# Patient Record
Sex: Male | Born: 1950 | Race: White | Hispanic: No | Marital: Married | Smoking: Current every day smoker
Health system: Southern US, Community
[De-identification: ages and names within clinical notes are randomized; demographics above are authoritative.]

## PROBLEM LIST (undated history)

## (undated) DIAGNOSIS — C801 Malignant (primary) neoplasm, unspecified: Secondary | ICD-10-CM

## (undated) DIAGNOSIS — I10 Essential (primary) hypertension: Secondary | ICD-10-CM

## (undated) DIAGNOSIS — F1010 Alcohol abuse, uncomplicated: Secondary | ICD-10-CM

## (undated) HISTORY — PX: COLONOSCOPY W/ BIOPSIES AND POLYPECTOMY: SHX1376

---

## 2012-01-17 ENCOUNTER — Emergency Department (HOSPITAL_COMMUNITY)
Admission: EM | Admit: 2012-01-17 | Discharge: 2012-01-18 | Disposition: A | Discharge status: Other Acute Inpatient Hospital | Payer: PRIVATE HEALTH INSURANCE | Attending: Emergency Medicine | Admitting: Emergency Medicine

## 2012-01-17 ENCOUNTER — Encounter (HOSPITAL_COMMUNITY): Payer: Self-pay

## 2012-01-17 DIAGNOSIS — I10 Essential (primary) hypertension: Secondary | ICD-10-CM | POA: Insufficient documentation

## 2012-01-17 DIAGNOSIS — F17200 Nicotine dependence, unspecified, uncomplicated: Secondary | ICD-10-CM | POA: Insufficient documentation

## 2012-01-17 DIAGNOSIS — Z79899 Other long term (current) drug therapy: Secondary | ICD-10-CM | POA: Insufficient documentation

## 2012-01-17 DIAGNOSIS — Z008 Encounter for other general examination: Secondary | ICD-10-CM | POA: Insufficient documentation

## 2012-01-17 DIAGNOSIS — F1010 Alcohol abuse, uncomplicated: Secondary | ICD-10-CM

## 2012-01-17 DIAGNOSIS — F1020 Alcohol dependence, uncomplicated: Secondary | ICD-10-CM | POA: Insufficient documentation

## 2012-01-17 DIAGNOSIS — R7989 Other specified abnormal findings of blood chemistry: Secondary | ICD-10-CM | POA: Insufficient documentation

## 2012-01-17 HISTORY — DX: Essential (primary) hypertension: I10

## 2012-01-17 HISTORY — DX: Alcohol abuse, uncomplicated: F10.10

## 2012-01-17 LAB — COMPREHENSIVE METABOLIC PANEL
ALT: 131 U/L — ABNORMAL HIGH (ref 0–53)
AST: 120 U/L — ABNORMAL HIGH (ref 0–37)
Albumin: 4 g/dL (ref 3.5–5.2)
Calcium: 9 mg/dL (ref 8.4–10.5)
Sodium: 135 mEq/L (ref 135–145)
Total Protein: 7.3 g/dL (ref 6.0–8.3)

## 2012-01-17 LAB — CBC
MCH: 33.2 pg (ref 26.0–34.0)
MCHC: 35.9 g/dL (ref 30.0–36.0)
Platelets: 152 10*3/uL (ref 150–400)
RBC: 5 MIL/uL (ref 4.22–5.81)

## 2012-01-17 LAB — RAPID URINE DRUG SCREEN, HOSP PERFORMED
Amphetamines: NOT DETECTED
Opiates: NOT DETECTED

## 2012-01-17 LAB — ETHANOL: Alcohol, Ethyl (B): 140 mg/dL — ABNORMAL HIGH (ref 0–11)

## 2012-01-17 MED ORDER — ADULT MULTIVITAMIN W/MINERALS CH
1.0000 | ORAL_TABLET | Freq: Every day | ORAL | Status: DC
  Administered 2012-01-17: 1 via ORAL
  Filled 2012-01-17: qty 1

## 2012-01-17 MED ORDER — LORAZEPAM 1 MG PO TABS
1.0000 mg | ORAL_TABLET | Freq: Three times a day (TID) | ORAL | Status: DC | PRN
  Filled 2012-01-17: qty 1

## 2012-01-17 MED ORDER — HYDROCHLOROTHIAZIDE 12.5 MG PO CAPS
12.5000 mg | ORAL_CAPSULE | Freq: Every day | ORAL | Status: DC
  Administered 2012-01-17: 12.5 mg via ORAL
  Filled 2012-01-17: qty 1

## 2012-01-17 MED ORDER — FOLIC ACID 1 MG PO TABS
1.0000 mg | ORAL_TABLET | Freq: Every day | ORAL | Status: DC
  Administered 2012-01-17: 1 mg via ORAL
  Filled 2012-01-17: qty 1

## 2012-01-17 MED ORDER — NICOTINE 21 MG/24HR TD PT24
21.0000 mg | MEDICATED_PATCH | Freq: Once | TRANSDERMAL | Status: DC
  Administered 2012-01-17: 21 mg via TRANSDERMAL
  Filled 2012-01-17: qty 1

## 2012-01-17 MED ORDER — LISINOPRIL-HYDROCHLOROTHIAZIDE 10-12.5 MG PO TABS: 1.0000 | ORAL_TABLET | Freq: Every day | ORAL | Status: DC

## 2012-01-17 MED ORDER — ACETAMINOPHEN 325 MG PO TABS: 650.0000 mg | ORAL_TABLET | ORAL | Status: DC | PRN

## 2012-01-17 MED ORDER — LORAZEPAM 1 MG PO TABS
1.0000 mg | ORAL_TABLET | Freq: Four times a day (QID) | ORAL | Status: DC | PRN
  Administered 2012-01-17: 1 mg via ORAL

## 2012-01-17 MED ORDER — THIAMINE HCL 100 MG/ML IJ SOLN: 100.0000 mg | Freq: Every day | INTRAMUSCULAR | Status: DC

## 2012-01-17 MED ORDER — IBUPROFEN 200 MG PO TABS: 600.0000 mg | ORAL_TABLET | Freq: Three times a day (TID) | ORAL | Status: DC | PRN

## 2012-01-17 MED ORDER — VITAMIN B-1 100 MG PO TABS
100.0000 mg | ORAL_TABLET | Freq: Every day | ORAL | Status: DC
  Administered 2012-01-17: 100 mg via ORAL
  Filled 2012-01-17: qty 1

## 2012-01-17 MED ORDER — LISINOPRIL 10 MG PO TABS
10.0000 mg | ORAL_TABLET | Freq: Every day | ORAL | Status: DC
  Administered 2012-01-17: 10 mg via ORAL
  Filled 2012-01-17: qty 1

## 2012-01-17 MED ORDER — LORAZEPAM 2 MG/ML IJ SOLN: 1.0000 mg | Freq: Four times a day (QID) | INTRAMUSCULAR | Status: DC | PRN

## 2012-01-17 NOTE — ED Notes (Signed)
MD at bedside. 

## 2012-01-17 NOTE — BH Assessment (Addendum)
Assessment Note   Greg Lewis is an 61 y.o. male who presents to [facility] voluntarily requesting detox from alcohol. Pt reports he drinks 12 beers daily and has for the past 30 days. He reports a history of alcohol abuse and states he has been drinking heavily on and off since age 68. He reports he currently began drinking again during do to a hip injury. 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 He states he started drinking to numb the pain and relapsed. He states he was sober for 1 year prior to relapse. He reports prior inpatient treatment at [facility] in 2012. He has no current outpatient providers. He has a history of blackouts during withdrawal. No history of seizures.  Pt lives with his wife in [place] who he states is very supportive of him. He denies SI, HI, and AHVH.    Axis I: Alcohol Dependence Axis II: Deferred Axis III:  Past Medical History  Diagnosis Date  . Hypertension   . ETOH abuse    Axis IV: other psychosocial or environmental problems Axis V: 51-60 moderate symptoms  Past Medical History:  Past Medical History  Diagnosis Date  . Hypertension   . ETOH abuse     History reviewed. No pertinent past surgical history.  Family History: No family history on file.  Social History:  reports that he has been smoking.  He does not have any smokeless tobacco history on file. He reports that he drinks alcohol. He reports that he does not use illicit drugs.  Additional Social History:  Alcohol / Drug Use History of alcohol / drug use?: Yes Substance #1 Name of Substance 1: alcohol 1 - Age of First Use: 20s 1 - Amount (size/oz): 12 pack of beer 1 - Frequency: daily 1 - Duration: 30 days, heavily since age 51 1 - Last Use / Amount: 01/17/12  CIWA: CIWA-Ar BP: 154/92 mmHg Pulse Rate: 72  COWS:    Allergies: No Known Allergies  Home Medications:  (Not in a hospital admission)  OB/GYN Status:  No LMP for male patient.  General Assessment  Data Location of Assessment: [facility] Living Arrangements: Spouse/significant other Can pt return to current living arrangement?: Yes Admission Status: Voluntary Is patient capable of signing voluntary admission?: Yes Transfer from: Acute Hospital Referral Source: Self/Family/Friend  Education Status Is patient currently in school?: No  Risk to self Suicidal Ideation: No Suicidal Intent: No Is patient at risk for suicide?: No Suicidal Plan?: No Access to Means: No What has been your use of drugs/alcohol within the last 12 months?: alcohol Previous Attempts/Gestures: No How many times?: 0  Other Self Harm Risks: none Triggers for Past Attempts: None known Intentional Self Injurious Behavior: None Family Suicide History: No Recent stressful life event(s): Other (Comment) (chronic pain) Persecutory voices/beliefs?: No Depression: No Depression Symptoms: Guilt Substance abuse history and/or treatment for substance abuse?: Yes Suicide prevention information given to non-admitted patients: Not applicable  Risk to Others Homicidal Ideation: No Thoughts of Harm to Others: No Current Homicidal Intent: No Current Homicidal Plan: No Access to Homicidal Means: No Identified Victim: none History of harm to others?: No Assessment of Violence: None Noted Violent Behavior Description: coopeartive Does patient have access to weapons?: No Criminal Charges Pending?: No Does patient have a court date: No  Psychosis Hallucinations: None noted Delusions: None noted  Mental Status Report Appear/Hygiene: Disheveled Eye Contact: Fair Motor Activity: Unremarkable Speech: Slurred Level of Consciousness: Alert Mood:  Anxious Affect: Anxious Anxiety Level: Moderate Thought Processes: Coherent;Relevant Judgement: Impaired Orientation: Person;Place;Time;Situation Obsessive Compulsive Thoughts/Behaviors: None  Cognitive Functioning Concentration: Normal Memory: Recent Intact;Remote  Intact IQ: Average Insight: Fair Impulse Control: Poor Appetite: Good Weight Loss: 0  Weight Gain: 0  Sleep: No Change Vegetative Symptoms: None  ADLScreening [facility] Assessment Services) Patient's cognitive ability adequate to safely complete daily activities?: Yes Patient able to express need for assistance with ADLs?: Yes Independently performs ADLs?: Yes (appropriate for developmental age)  Abuse/Neglect [facility]) Physical Abuse: Denies Verbal Abuse: Denies Sexual Abuse: Denies  Prior Inpatient Therapy Prior Inpatient Therapy: Yes Prior Therapy Dates: 2012 Prior Therapy Facilty/Provider(s): [facility] Reason for Treatment: SA  Prior Outpatient Therapy Prior Outpatient Therapy: No Prior Therapy Dates: na Prior Therapy Facilty/Provider(s): na Reason for Treatment: na  ADL Screening (condition at time of admission) Patient's cognitive ability adequate to safely complete daily activities?: Yes Patient able to express need for assistance with ADLs?: Yes Independently performs ADLs?: Yes (appropriate for developmental age) Weakness of Legs: None Weakness of Arms/Hands: None  Home Assistive Devices/Equipment Home Assistive Devices/Equipment: None    Abuse/Neglect Assessment (Assessment to be complete while patient is alone) Physical Abuse: Denies Verbal Abuse: Denies Sexual Abuse: Denies Exploitation of patient/patient's resources: Denies Self-Neglect: Denies Values / Beliefs Cultural Requests During Hospitalization: None Spiritual Requests During Hospitalization: None   Advance Directives (For Healthcare) Advance Directive: Patient does not have advance directive Pre-existing out of facility DNR order (yellow form or pink MOST form): No Nutrition Screen- [facility] Adult/[facility] Patient's home diet: Regular Have you recently lost weight without trying?: No Have you been eating poorly because of a decreased appetite?: No Malnutrition Screening Tool Score: 0   Additional  Information 1:1 In Past 12 Months?: No CIRT Risk: No Elopement Risk: No Does patient have medical clearance?: Yes     Disposition:  Disposition Disposition of Patient: Referred to;Inpatient treatment program Type of inpatient treatment program: Adult Patient referred to: [facility] Accepted to [facility] to the care of Dr. Dub Mikes on the 300 hall. EDP has been notified and agrees with plan. Support paperwork has been signed and faxed to [facility].  On Site Evaluation by:   Reviewed with Physician:     Georgina Quint A 01/17/2012 10:43 PM

## 2012-01-17 NOTE — ED Notes (Signed)
Family at bedside. 

## 2012-01-17 NOTE — ED Provider Notes (Signed)
History     CSN: [id number]  Arrival date & time 01/17/12  1453   First MD Initiated Contact with Patient 01/17/12 1657      Chief Complaint  Patient presents with  . Alcohol Problem  . Medical Clearance    detox    (Consider location/radiation/quality/duration/timing/severity/associated sxs/prior treatment) The history is provided by the patient.  Edu On is a 61 y.o. male hx of HTN, chronic alcoholic here wanting detox. He has been binging alcohol in the last month. He drinks about 12 pack daily for the last month. Today, he only drank 4 beers this AM. Denies any other substance use. He does smoke cigarettes. No hx of withdrawal or seizures.    Past Medical History  Diagnosis Date  . Hypertension   . ETOH abuse     History reviewed. No pertinent past surgical history.  No family history on file.  History  Substance Use Topics  . Smoking status: Current Every Day Smoker  . Smokeless tobacco: Not on file  . Alcohol Use: Yes      Review of Systems  All other systems reviewed and are negative.    Allergies  Review of patient's allergies indicates no known allergies.  Home Medications   Current Outpatient Rx  Name Route Sig Dispense Refill  . IBUPROFEN 200 MG PO TABS Oral Take 200 mg by mouth every 6 (six) hours as needed. pain    . LISINOPRIL-HYDROCHLOROTHIAZIDE 10-12.5 MG PO TABS Oral Take 1 tablet by mouth daily.      BP 154/92  Pulse 72  Temp 97.6 F (36.4 C) (Oral)  Resp 18  Wt 165 lb (74.844 kg)  SpO2 98%  Physical Exam  Nursing note and vitals reviewed. Constitutional: He is oriented to person, place, and time. He appears well-developed and well-nourished. No distress.  HENT:  Head: Normocephalic.  Mouth/Throat: Oropharynx is clear and moist.  Eyes: Conjunctivae normal are normal. Pupils are equal, round, and reactive to light.  Neck: Normal range of motion. Neck supple.  Cardiovascular: Normal rate, regular rhythm and normal heart  sounds.   Pulmonary/Chest: Breath sounds normal. No respiratory distress. He has no wheezes. He has no rales.  Abdominal: Soft. Bowel sounds are normal. He exhibits no distension. There is no tenderness. There is no rebound.  Musculoskeletal: Normal range of motion.  Neurological: He is alert and oriented to person, place, and time. No cranial nerve deficit.  Skin: Skin is warm and dry.  Psychiatric: He has a normal mood and affect. His behavior is normal. Judgment and thought content normal.    ED Course  Procedures (including critical care time)  Labs Reviewed  COMPREHENSIVE METABOLIC PANEL - Abnormal; Notable for the following:    Chloride 95 (*)     AST 120 (*)     ALT 131 (*)     All other components within normal limits  ETHANOL - Abnormal; Notable for the following:    Alcohol, Ethyl (B) 140 (*)     All other components within normal limits  CBC  URINE RAPID DRUG SCREEN (HOSP PERFORMED)   No results found.   1. Alcohol abuse       MDM  Weylin Plagge is a 61 y.o. male here for alcohol detox. Doesn't appear intoxicated. Will get labs and contact ACT for placement.   6:19 PM Labs showed ETOH 140, but patient clinically sober. LFT elevated likely from chronic alcohol use. No abdominal pain. ACT to see patient  Richardean Canal, MD 01/17/12 Zollie Pee

## 2012-01-17 NOTE — ED Notes (Signed)
Pt presents with family with NAD- here for detox from etoh- denies si/hi at present.  Last drink was this am- 12pk/daily

## 2012-01-18 ENCOUNTER — Inpatient Hospital Stay (HOSPITAL_COMMUNITY)
Admission: EM | Admit: 2012-01-18 | Discharge: 2012-01-22 | DRG: 897 | Disposition: A | Discharge status: Home / Self Care | Payer: PRIVATE HEALTH INSURANCE | Source: Ambulatory Visit | Attending: Psychiatry | Admitting: Psychiatry

## 2012-01-18 ENCOUNTER — Encounter (HOSPITAL_COMMUNITY): Payer: Self-pay | Admitting: *Deleted

## 2012-01-18 DIAGNOSIS — M25559 Pain in unspecified hip: Secondary | ICD-10-CM | POA: Diagnosis present

## 2012-01-18 DIAGNOSIS — R7402 Elevation of levels of lactic acid dehydrogenase (LDH): Secondary | ICD-10-CM | POA: Diagnosis present

## 2012-01-18 DIAGNOSIS — F17200 Nicotine dependence, unspecified, uncomplicated: Secondary | ICD-10-CM | POA: Diagnosis present

## 2012-01-18 DIAGNOSIS — R748 Abnormal levels of other serum enzymes: Secondary | ICD-10-CM | POA: Diagnosis present

## 2012-01-18 DIAGNOSIS — R7401 Elevation of levels of liver transaminase levels: Secondary | ICD-10-CM | POA: Diagnosis present

## 2012-01-18 DIAGNOSIS — F1020 Alcohol dependence, uncomplicated: Principal | ICD-10-CM | POA: Diagnosis present

## 2012-01-18 MED ORDER — LISINOPRIL 10 MG PO TABS
10.0000 mg | ORAL_TABLET | Freq: Every day | ORAL | Status: DC
  Administered 2012-01-18 – 2012-01-22 (×5): 10 mg via ORAL
  Filled 2012-01-18 (×6): qty 1

## 2012-01-18 MED ORDER — ALUM & MAG HYDROXIDE-SIMETH 200-200-20 MG/5ML PO SUSP
30.0000 mL | ORAL | Status: DC | PRN
  Administered 2012-01-21: 30 mL via ORAL

## 2012-01-18 MED ORDER — HYDROXYZINE HCL 25 MG PO TABS: 25.0000 mg | ORAL_TABLET | Freq: Four times a day (QID) | ORAL | Status: AC | PRN

## 2012-01-18 MED ORDER — TRAZODONE HCL 50 MG PO TABS
50.0000 mg | ORAL_TABLET | Freq: Every evening | ORAL | Status: DC | PRN
  Administered 2012-01-18 – 2012-01-19 (×2): 50 mg via ORAL
  Filled 2012-01-18 (×3): qty 1

## 2012-01-18 MED ORDER — ONDANSETRON 4 MG PO TBDP: 4.0000 mg | ORAL_TABLET | Freq: Four times a day (QID) | ORAL | Status: AC | PRN

## 2012-01-18 MED ORDER — CHLORDIAZEPOXIDE HCL 25 MG PO CAPS
25.0000 mg | ORAL_CAPSULE | Freq: Three times a day (TID) | ORAL | Status: AC
  Administered 2012-01-19 (×3): 25 mg via ORAL
  Filled 2012-01-18 (×3): qty 1

## 2012-01-18 MED ORDER — ACETAMINOPHEN 325 MG PO TABS: 650.0000 mg | ORAL_TABLET | Freq: Four times a day (QID) | ORAL | Status: DC | PRN

## 2012-01-18 MED ORDER — CHLORDIAZEPOXIDE HCL 25 MG PO CAPS
25.0000 mg | ORAL_CAPSULE | ORAL | Status: AC
  Administered 2012-01-20 (×2): 25 mg via ORAL
  Filled 2012-01-18 (×2): qty 1

## 2012-01-18 MED ORDER — CHLORDIAZEPOXIDE HCL 25 MG PO CAPS
50.0000 mg | ORAL_CAPSULE | Freq: Once | ORAL | Status: AC
  Administered 2012-01-18: 50 mg via ORAL
  Filled 2012-01-18: qty 2

## 2012-01-18 MED ORDER — VITAMIN B-1 100 MG PO TABS
100.0000 mg | ORAL_TABLET | Freq: Every day | ORAL | Status: DC
Start: 2012-01-19 — End: 2012-01-22
  Administered 2012-01-19 – 2012-01-22 (×4): 100 mg via ORAL
  Filled 2012-01-18 (×5): qty 1

## 2012-01-18 MED ORDER — CHLORDIAZEPOXIDE HCL 25 MG PO CAPS
25.0000 mg | ORAL_CAPSULE | Freq: Four times a day (QID) | ORAL | Status: AC
  Administered 2012-01-18 (×4): 25 mg via ORAL
  Filled 2012-01-18 (×4): qty 1

## 2012-01-18 MED ORDER — LOPERAMIDE HCL 2 MG PO CAPS: 2.0000 mg | ORAL_CAPSULE | ORAL | Status: AC | PRN

## 2012-01-18 MED ORDER — ADULT MULTIVITAMIN W/MINERALS CH
1.0000 | ORAL_TABLET | Freq: Every day | ORAL | Status: DC
  Administered 2012-01-18 – 2012-01-22 (×5): 1 via ORAL
  Filled 2012-01-18 (×6): qty 1

## 2012-01-18 MED ORDER — MAGNESIUM HYDROXIDE 400 MG/5ML PO SUSP: 30.0000 mL | Freq: Every day | ORAL | Status: DC | PRN

## 2012-01-18 MED ORDER — CHLORDIAZEPOXIDE HCL 25 MG PO CAPS: 25.0000 mg | ORAL_CAPSULE | Freq: Four times a day (QID) | ORAL | Status: AC | PRN

## 2012-01-18 MED ORDER — HYDROCHLOROTHIAZIDE 12.5 MG PO CAPS
12.5000 mg | ORAL_CAPSULE | Freq: Every day | ORAL | Status: DC
  Administered 2012-01-18 – 2012-01-22 (×5): 12.5 mg via ORAL
  Filled 2012-01-18 (×6): qty 1

## 2012-01-18 MED ORDER — THIAMINE HCL 100 MG/ML IJ SOLN: 100.0000 mg | Freq: Once | INTRAMUSCULAR | Status: DC

## 2012-01-18 MED ORDER — CHLORDIAZEPOXIDE HCL 25 MG PO CAPS
25.0000 mg | ORAL_CAPSULE | Freq: Every day | ORAL | Status: AC
  Administered 2012-01-21: 25 mg via ORAL
  Filled 2012-01-18: qty 1

## 2012-01-18 NOTE — Progress Notes (Signed)
D: Patient pleasant and attending various milieu activities in the milieu, has stated that he's feeling ok and has not had really any signs or symptoms of withdrawal. A: Support and encouragement provided to patient.Pt has received all medications without incident.  R: Will continue to monitor.
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
## 2012-01-18 NOTE — Progress Notes (Signed)
[facility] MD Progress Note  01/18/2012 6:12 PM  Diagnosis:   Axis I: Alcohol Dependence Axis II: Deferred Axis III:  Past Medical History  Diagnosis Date  . Hypertension   . ETOH abuse    Axis IV: problems with primary support group Axis V: 51-60 moderate symptoms  ADL's:  Intact  Sleep: Fair  Appetite:  Fair  Suicidal Ideation:  Plan:  Denies Intent:  denies Means:  Denies Homicidal Ideation:  Plan:  Denies Intent:  Denies Means:  Denies  61 Y/O male with a history of alcohol dependency who comes after having relapsed around a month ago. He had been sober for over a year. He was a brick layer for years. This has left him with back, joint pain. He overexerted himself doing some work and experienced severe pain. He feels that this set the stage for a relapse. He claims he did not have much to help his pain.  He was at [facility]. He staid 30 days. He came out and was able to abstain until now.  He has family history of alcoholism. His brother died of lung cancer. He is a chronic smoker of one and 1/2 packs of cigarettes per day  Mental Status Examination/Evaluation: Objective:  Appearance: Fairly Groomed  Patent attorney::  Fair  Speech:  Normal Rate  Volume:  Normal  Mood:  Anxious  Affect:  Appropriate  Thought Process:  Coherent, Goal Directed, Intact and Logical  Orientation:  Full  Thought Content:  WDL  Suicidal Thoughts:  No  Homicidal Thoughts:  No  Memory:  Immediate;   Fair Recent;   Fair Remote;   Fair  Judgement:  Intact  Insight:  Present  Psychomotor Activity:  Normal  Concentration:  Fair  Recall:  Fair  Akathisia:  No  Handed:  Right  AIMS (if indicated):     Assets:  Desire for Improvement Financial Resources/Insurance Housing Social Support  Sleep:  Number of Hours: 1.75    Vital Signs:Blood pressure 121/86, pulse 118, temperature 98.2 F (36.8 C), temperature source Oral, resp. rate 16, height 5\' 2"  (1.575 m). Current  Medications: Current Facility-Administered Medications  Medication Dose Route Frequency Provider Last Rate Last Dose  . acetaminophen (TYLENOL) tablet 650 mg  650 mg Oral Q6H PRN Jorje Guild, PA-C      . alum & mag hydroxide-simeth (MAALOX/MYLANTA) 200-200-20 MG/5ML suspension 30 mL  30 mL Oral Q4H PRN Jorje Guild, PA-C      . chlordiazePOXIDE (LIBRIUM) capsule 25 mg  25 mg Oral Q6H PRN Jorje Guild, PA-C      . chlordiazePOXIDE (LIBRIUM) capsule 25 mg  25 mg Oral QID Jorje Guild, PA-C   25 mg at 01/18/12 1731   Followed by  . chlordiazePOXIDE (LIBRIUM) capsule 25 mg  25 mg Oral TID Jorje Guild, PA-C       Followed by  . chlordiazePOXIDE (LIBRIUM) capsule 25 mg  25 mg Oral BH-qamhs Jorje Guild, PA-C       Followed by  . chlordiazePOXIDE (LIBRIUM) capsule 25 mg  25 mg Oral Daily Jorje Guild, PA-C      . chlordiazePOXIDE (LIBRIUM) capsule 50 mg  50 mg Oral Once Jorje Guild, PA-C   50 mg at 01/18/12 0401  . hydrochlorothiazide (MICROZIDE) capsule 12.5 mg  12.5 mg Oral Daily Jorje Guild, PA-C   12.5 mg at 01/18/12 9528  . hydrOXYzine (ATARAX/VISTARIL) tablet 25 mg  25 mg Oral Q6H PRN Jorje Guild, PA-C      .  lisinopril (PRINIVIL,ZESTRIL) tablet 10 mg  10 mg Oral Daily Jorje Guild, PA-C   10 mg at 01/18/12 8119  . loperamide (IMODIUM) capsule 2-4 mg  2-4 mg Oral PRN Jorje Guild, PA-C      . magnesium hydroxide (MILK OF MAGNESIA) suspension 30 mL  30 mL Oral Daily PRN Jorje Guild, PA-C      . multivitamin with minerals tablet 1 tablet  1 tablet Oral Daily Jorje Guild, PA-C   1 tablet at 01/18/12 0837  . ondansetron (ZOFRAN-ODT) disintegrating tablet 4 mg  4 mg Oral Q6H PRN Jorje Guild, PA-C      . thiamine (B-1) injection 100 mg  100 mg Intramuscular Once Jorje Guild, PA-C      . thiamine (VITAMIN B-1) tablet 100 mg  100 mg Oral Daily Jorje Guild, PA-C      . traZODone (DESYREL) tablet 50 mg  50 mg Oral QHS PRN Jorje Guild, PA-C       Facility-Administered Medications Ordered in Other Encounters  Medication Dose Route Frequency  Provider Last Rate Last Dose  . DISCONTD: acetaminophen (TYLENOL) tablet 650 mg  650 mg Oral Q4H PRN Richardean Canal, MD      . DISCONTD: folic acid (FOLVITE) tablet 1 mg  1 mg Oral Daily Richardean Canal, MD   1 mg at 01/17/12 1806  . DISCONTD: hydrochlorothiazide (MICROZIDE) capsule 12.5 mg  12.5 mg Oral Daily Richardean Canal, MD   12.5 mg at 01/17/12 1806  . DISCONTD: ibuprofen (ADVIL,MOTRIN) tablet 600 mg  600 mg Oral Q8H PRN Richardean Canal, MD      . DISCONTD: lisinopril (PRINIVIL,ZESTRIL) tablet 10 mg  10 mg Oral Daily Richardean Canal, MD   10 mg at 01/17/12 1806  . DISCONTD: LORazepam (ATIVAN) injection 1 mg  1 mg Intravenous Q6H PRN Richardean Canal, MD      . DISCONTD: LORazepam (ATIVAN) tablet 1 mg  1 mg Oral Q8H PRN Richardean Canal, MD      . DISCONTD: LORazepam (ATIVAN) tablet 1 mg  1 mg Oral Q6H PRN Richardean Canal, MD   1 mg at 01/17/12 2313  . DISCONTD: multivitamin with minerals tablet 1 tablet  1 tablet Oral Daily Richardean Canal, MD   1 tablet at 01/17/12 1806  . DISCONTD: nicotine (NICODERM CQ - dosed in mg/24 hours) patch 21 mg  21 mg Transdermal Once Benny Lennert, MD   21 mg at 01/17/12 2247  . DISCONTD: thiamine (B-1) injection 100 mg  100 mg Intravenous Daily Richardean Canal, MD      . DISCONTD: thiamine (VITAMIN B-1) tablet 100 mg  100 mg Oral Daily Richardean Canal, MD   100 mg at 01/17/12 1806    Lab Results:  Results for orders placed during the hospital encounter of 01/17/12 (from the past 48 hour(s))  URINE RAPID DRUG SCREEN (HOSP PERFORMED)     Status: Normal   Collection Time   01/17/12  4:32 PM      Component Value Range Comment   Opiates NONE DETECTED  NONE DETECTED    Cocaine NONE DETECTED  NONE DETECTED    Benzodiazepines NONE DETECTED  NONE DETECTED    Amphetamines NONE DETECTED  NONE DETECTED    Tetrahydrocannabinol NONE DETECTED  NONE DETECTED    Barbiturates NONE DETECTED  NONE DETECTED   CBC     Status: Normal   Collection Time   01/17/12  4:59 PM  Component Value Range Comment   WBC  6.1  4.0 - 10.5 K/uL    RBC 5.00  4.22 - 5.81 MIL/uL    Hemoglobin 16.6  13.0 - 17.0 g/dL    HCT 16.1  09.6 - 04.5 %    MCV 92.6  78.0 - 100.0 fL    MCH 33.2  26.0 - 34.0 pg    MCHC 35.9  30.0 - 36.0 g/dL    RDW 40.9  81.1 - 91.4 %    Platelets 152  150 - 400 K/uL   COMPREHENSIVE METABOLIC PANEL     Status: Abnormal   Collection Time   01/17/12  4:59 PM      Component Value Range Comment   Sodium 135  135 - 145 mEq/L    Potassium 4.3  3.5 - 5.1 mEq/L    Chloride 95 (*) 96 - 112 mEq/L    CO2 28  19 - 32 mEq/L    Glucose, Bld 92  70 - 99 mg/dL    BUN 10  6 - 23 mg/dL    Creatinine, Ser 7.82  0.50 - 1.35 mg/dL    Calcium 9.0  8.4 - 95.6 mg/dL    Total Protein 7.3  6.0 - 8.3 g/dL    Albumin 4.0  3.5 - 5.2 g/dL    AST 213 (*) 0 - 37 U/L    ALT 131 (*) 0 - 53 U/L    Alkaline Phosphatase 85  39 - 117 U/L    Total Bilirubin 0.5  0.3 - 1.2 mg/dL    GFR calc non Af Amer >90  >90 mL/min    GFR calc Af Amer >90  >90 mL/min   ETHANOL     Status: Abnormal   Collection Time   01/17/12  4:59 PM      Component Value Range Comment   Alcohol, Ethyl (B) 140 (*) 0 - 11 mg/dL     Physical Findings: AIMS: Facial and Oral Movements Muscles of Facial Expression: None, normal Lips and Perioral Area: None, normal Jaw: None, normal Tongue: None, normal,Extremity Movements Upper (arms, wrists, hands, fingers): None, normal Lower (legs, knees, ankles, toes): None, normal, Trunk Movements Neck, shoulders, hips: None, normal, Overall Severity Severity of abnormal movements (highest score from questions above): None, normal Incapacitation due to abnormal movements: None, normal Patient's awareness of abnormal movements (rate only patient's report): No Awareness, Dental Status Current problems with teeth and/or dentures?: No Does patient usually wear dentures?: Yes  CIWA:  CIWA-Ar Total: 2  COWS:  COWS Total Score: 2   Treatment Plan Summary: Daily contact with patient to assess and evaluate  symptoms and progress in treatment Medication management  Plan: Alcohol detox           Reassess anxiety, mood.           Relapse prevention  Lynford Espinoza A 01/18/2012, 6:12 PM

## 2012-01-18 NOTE — Progress Notes (Signed)
Psychoeducational Group Note  Date:  01/18/2012 Time:  1100  Group Topic/Focus:  Rediscovering Joy:   The focus of this group is to explore various ways to relieve stress in a positive manner.  Participation Level:  Active  Participation Quality:  Appropriate, Attentive and Sharing  Affect:  Appropriate  Cognitive:  Alert and Appropriate  Insight:  Good  Engagement in Group:  Good  Additional Comments:   Pt appeared to understand the importance of looking for happy, joyful things in his life. [place] animals and water animals. He reported how he enjoyed feeding a flock of ducks and watching river otters, [place], and deer.   Gwyndolyn Kaufman 01/18/2012, 2:10 PM
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
## 2012-01-18 NOTE — Progress Notes (Signed)
Psychoeducational Group Note  Date:  01/18/2012 Time:  2000  Group Topic/Focus:  Karaoke   Participation Level:  Active  Participation Quality:  Appropriate  Affect:  Appropriate  Cognitive:  Appropriate  Insight:  Good  Engagement in Group:  Good  Additional Comments:    Jaeleah Smyser A 01/18/2012, 10:16 PM

## 2012-01-18 NOTE — Tx Team (Signed)
Initial Interdisciplinary Treatment Plan  PATIENT STRENGTHS: (choose at least two) Ability for insight Capable of independent living Motivation for treatment/growth Physical Health Supportive family/friends  PATIENT STRESSORS: Financial difficulties Substance abuse   PROBLEM LIST: Problem List/Patient Goals Date to be addressed Date deferred Reason deferred Estimated date of resolution  Alcohol Abuse 01/18/12                                                      DISCHARGE CRITERIA:  Ability to meet basic life and health needs Adequate post-discharge living arrangements Improved stabilization in mood, thinking, and/or behavior Motivation to continue treatment in a less acute level of care Verbal commitment to aftercare and medication compliance Withdrawal symptoms are absent or subacute and managed without 24-hour nursing intervention  PRELIMINARY DISCHARGE PLAN: Attend aftercare/continuing care group Attend PHP/IOP Attend 12-step recovery group Outpatient therapy Participate in family therapy Referrals indicated:  Long term treatment  PATIENT/FAMIILY INVOLVEMENT: This treatment plan has been presented to and reviewed with the patient, Greg Lewis, and/or family member  The patient and family have been given the opportunity to ask questions and make suggestions.  [name] [facility] 01/18/2012, 4:22 AM
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
## 2012-01-18 NOTE — Progress Notes (Signed)
D: Patient pleasant and cooperative with staff and peers. Patient reported on self inventory sheet that he slept well, appetite is good and energy level is normal.   A: Support and encouragement provided to patient. Administered scheduled medications per MD orders. Monitor 15 minute checks throughout the work day.  R: Patient receptive. Patient remains safe. Denies SI/HI/AVH and pain.

## 2012-01-18 NOTE — Social Work (Signed)
Aftercare Planning Group: 01/18/2012 9:45 AM  Pt attended discharge planning group and actively participated in group.  SW provided pt with today's workbook.  Pt presents with calm mood and affect.  Pt denies having depression, anxiety and SI/HI.  Pt states that he came here to detox off of alcohol.  Pt states that he has been to [facility] [place] before.  Pt spoke highly of his family support.  Pt states that he lives in [place], [place] with his wife and can return there.  Pt states that he doesn't want any referrals for follow up at this time.  SW provided pt with a list of meeting in his area.  SW will continue to assess for appropriate referrals.  No further needs voiced by pt at this time.  Safety planning and suicide prevention discussed.  Pt participated in discussion and acknowledged an understanding of the information provided.        [facility] Group Notes:  (Counselor/Nursing/MHT/Case Management/Adjunct)  01/18/2012  1:15 PM  Type of Therapy:  Group Therapy  Participation Level:  Active  Participation Quality:  Appropriate and Attentive  Affect:  Depressed and Flat  Cognitive:  Alert and Appropriate  Insight:  Good  Engagement in Group:  Good  Engagement in Therapy:  Good  Modes of Intervention:  Socialization and Support  Summary of Progress/Problems: The topic for group was balance in life.  Pt participated in the discussion about when their life was in balance and out of balance and how this feels.  Pt discussed ways to get back in balance and short term goals they can work on to get where they want to be.  Pt discussed being used to always working and the change of not being able to work anymore and how this is hard for him.     Greg Lewis, LCSWA 01/18/2012 9:53 AM
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
## 2012-01-19 MED ORDER — ENSURE COMPLETE PO LIQD
237.0000 mL | Freq: Two times a day (BID) | ORAL | Status: DC
  Administered 2012-01-20 – 2012-01-22 (×6): 237 mL via ORAL

## 2012-01-19 MED ORDER — NICOTINE 21 MG/24HR TD PT24
21.0000 mg | MEDICATED_PATCH | Freq: Every day | TRANSDERMAL | Status: DC
  Administered 2012-01-19 – 2012-01-22 (×4): 21 mg via TRANSDERMAL
  Filled 2012-01-19 (×5): qty 1

## 2012-01-19 NOTE — Progress Notes (Signed)
Patient ID: Greg Lewis, male   DOB: 1950/10/13, 61 y.o.   MRN: [id number]  D: Patient lying in bed with eyes closed. Appears to be sleeping. Respirations even and non-labored. A: Staff will monitor on q 15 minute checks and follow treatment plan and give medications as ordered. R: No patient response due to sleeping.

## 2012-01-19 NOTE — Tx Team (Addendum)
Aftercare Planning Group: 01/19/2012 9:45 AM  Pt attended discharge planning group and actively participated in group.  SW provided pt with today's workbook.  Pt presents with calm mood and affect.  Pt denies having depression, anxiety and SI/HI.  Pt states that his plan when he goes home is to go back to work.  Pt states that he wants to go home instead of further treatment.  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  SW will assess for appropriate referrals.  No further needs voiced by pt at this time.    [facility] Group Notes:  (Counselor/Nursing/MHT/Case Management/Adjunct)  01/19/2012  1:15 PM  Type of Therapy:  Group Therapy  Participation Level:  Appropriate  Participation Quality:  Appropriate   Affect:  Appropriate  Cognitive:  Alert  Insight:  Good  Engagement in Group:  Good  Engagement in Therapy:  Good  Modes of Intervention:  Socialization and Support  Summary of Progress/Problems: The topic for today was feelings about relapse.  Pt discussed what relapse prevention is to them and identified triggers that they are on the path to relapse.  Pt processed their feeling towards relapse and was able to relate to peers.  Pt discussed coping skills that can be used for relapse prevention.   Pt gave support and encouragement with his peers.    Reyes Ivan, [place] 01/19/2012  2:36 PM

## 2012-01-19 NOTE — Progress Notes (Signed)
Psychoeducational Group Note  Date:  01/19/2012 Time:  1100  Group Topic/Focus:  Relapse Prevention Planning:   The focus of this group is to define relapse and discuss the need for planning to combat relapse.  Participation Level:  Active  Participation Quality:  Appropriate, Attentive, Sharing and Supportive  Affect:  Appropriate and Flat  Cognitive:  Alert and Appropriate  Insight:  Good  Engagement in Group:  Good  Additional Comments:  Pt attended and participated in group discussing relapse prevention planning. Pt was very active in group, discussing his previous history of alcoholism and relapse history. Pt was able to identify his reason for changing his behavior, as he does not want to die from his disease.   Dalia Heading 01/19/2012, 6:14 PM

## 2012-01-19 NOTE — Progress Notes (Signed)
Nutrition Note  Reason: MST score of 3  Patient reported his appetite and intake are much better. He reported he has been eating 100% of his meals. He reported over the past 6 weeks he has been drinking too much alcohol and only eating 1 meal daily. He reported he has lost 10 lb during that time. He reported he would like to gain the weight back. He reported he has a very high metabolism.   Wt Readings from Last 10 Encounters:  01/17/12 165 lb (74.844 kg)   I have encouraged the patient to have adequate PO intake with snacks in between meals. The patient set a goal to start eating regularly. He agreed to try Ensure nutrition supplement to increase caloric intake. The patient asked good nutrition questions. He was without any further nutrition related questions and verbalized understanding of the nutrition information provided.   Will order patient Ensure BID, provides 500 kcal and 18 grams of protein daily.   Iven Finn [facility] [phone]

## 2012-01-19 NOTE — Progress Notes (Signed)
[name] is out in milieu interacting with peers and attending groups.  Rates depression at 2 and hopelessness at 1.  Denies SI and contracts for safety.  Minimal insight into SA issues. Greg Lewis did state he had an 11 year period of sobriety that did not entail 12 step fellowship.  Minimal s/s of etoh withdrawal.  A- Continue current POC and evaluation of treatment goals. Support and encouragement. Librium protocol compliance and no prn medications requested.  Inrtoduction to 12 step concepts.  R- Remains safe on unit. Receptive to nursing intervention.

## 2012-01-19 NOTE — Progress Notes (Signed)
[facility] MD Progress Note  01/19/2012 2:06 PM  Diagnosis:   Axis I: Alcohol Dependence Axis II: Deferred Axis III:  Past Medical History  Diagnosis Date  . Hypertension   . ETOH abuse    Axis IV: occupational problems Axis V: 61-70 mild symptoms  ADL's:  Intact  Sleep: Fair  Appetite:  Fair  Suicidal Ideation:  Plan:  denies Intent:  Denies Means:  Denies Homicidal Ideation:  Plan:  Denies Intent:  Denies Means:  Denies  Still trying to understand what caused his relapse. His pain he thinks is the main reason. He has his hip that he thinks needs to be replaced. He still wants to go back to work but not to do the heavy physical work that he used to do. Admits that he needs to realize he is not as young as he used to be. His wife is supportive  Mental Status Examination/Evaluation: Objective:  Appearance: Fairly Groomed  Patent attorney::  Fair  Speech:  Normal Rate  Volume:  Decreased  Mood:  Anxious  Affect:  Anxious, little tremulous  Thought Process:  Coherent, Goal Directed, Intact and Linear  Orientation:  Full  Thought Content:  WDL  Suicidal Thoughts:  No  Homicidal Thoughts:  No  Memory:  Immediate;   Fair Recent;   Fair Remote;   Fair  Judgement:  Fair  Insight:  Present  Psychomotor Activity:  Tremor  Concentration:  Fair  Recall:  Fair  Akathisia:  No  Handed:  Right  AIMS (if indicated):     Assets:  Desire for Improvement Financial Resources/Insurance Housing Social Support Vocational/Educational  Sleep:  Number of Hours: 6.5    Vital Signs:Blood pressure 140/84, pulse 86, temperature 97.2 F (36.2 C), temperature source Oral, resp. rate 18, height 5\' 2"  (1.575 m). Current Medications: Current Facility-Administered Medications  Medication Dose Route Frequency Provider Last Rate Last Dose  . acetaminophen (TYLENOL) tablet 650 mg  650 mg Oral Q6H PRN Jorje Guild, PA-C      . alum & mag hydroxide-simeth (MAALOX/MYLANTA) 200-200-20 MG/5ML suspension  30 mL  30 mL Oral Q4H PRN Jorje Guild, PA-C      . chlordiazePOXIDE (LIBRIUM) capsule 25 mg  25 mg Oral Q6H PRN Jorje Guild, PA-C      . chlordiazePOXIDE (LIBRIUM) capsule 25 mg  25 mg Oral QID Jorje Guild, PA-C   25 mg at 01/18/12 2213   Followed by  . chlordiazePOXIDE (LIBRIUM) capsule 25 mg  25 mg Oral TID Jorje Guild, PA-C   25 mg at 01/19/12 1309   Followed by  . chlordiazePOXIDE (LIBRIUM) capsule 25 mg  25 mg Oral BH-qamhs Jorje Guild, PA-C       Followed by  . chlordiazePOXIDE (LIBRIUM) capsule 25 mg  25 mg Oral Daily Jorje Guild, PA-C      . hydrochlorothiazide (MICROZIDE) capsule 12.5 mg  12.5 mg Oral Daily Jorje Guild, PA-C   12.5 mg at 01/19/12 0818  . hydrOXYzine (ATARAX/VISTARIL) tablet 25 mg  25 mg Oral Q6H PRN Jorje Guild, PA-C      . lisinopril (PRINIVIL,ZESTRIL) tablet 10 mg  10 mg Oral Daily Jorje Guild, PA-C   10 mg at 01/19/12 0818  . loperamide (IMODIUM) capsule 2-4 mg  2-4 mg Oral PRN Jorje Guild, PA-C      . magnesium hydroxide (MILK OF MAGNESIA) suspension 30 mL  30 mL Oral Daily PRN Jorje Guild, PA-C      . multivitamin with minerals tablet 1 tablet  1 tablet Oral Daily Jorje Guild, PA-C   1 tablet at 01/19/12 0818  . ondansetron (ZOFRAN-ODT) disintegrating tablet 4 mg  4 mg Oral Q6H PRN Jorje Guild, PA-C      . thiamine (B-1) injection 100 mg  100 mg Intramuscular Once Jorje Guild, PA-C      . thiamine (VITAMIN B-1) tablet 100 mg  100 mg Oral Daily Jorje Guild, PA-C   100 mg at 01/19/12 0818  . traZODone (DESYREL) tablet 50 mg  50 mg Oral QHS PRN Jorje Guild, PA-C   50 mg at 01/18/12 2213    Lab Results:  Results for orders placed during the hospital encounter of 01/17/12 (from the past 48 hour(s))  URINE RAPID DRUG SCREEN (HOSP PERFORMED)     Status: Normal   Collection Time   01/17/12  4:32 PM      Component Value Range Comment   Opiates NONE DETECTED  NONE DETECTED    Cocaine NONE DETECTED  NONE DETECTED    Benzodiazepines NONE DETECTED  NONE DETECTED    Amphetamines NONE DETECTED  NONE  DETECTED    Tetrahydrocannabinol NONE DETECTED  NONE DETECTED    Barbiturates NONE DETECTED  NONE DETECTED   CBC     Status: Normal   Collection Time   01/17/12  4:59 PM      Component Value Range Comment   WBC 6.1  4.0 - 10.5 K/uL    RBC 5.00  4.22 - 5.81 MIL/uL    Hemoglobin 16.6  13.0 - 17.0 g/dL    HCT 14.7  82.9 - 56.2 %    MCV 92.6  78.0 - 100.0 fL    MCH 33.2  26.0 - 34.0 pg    MCHC 35.9  30.0 - 36.0 g/dL    RDW 13.0  86.5 - 78.4 %    Platelets 152  150 - 400 K/uL   COMPREHENSIVE METABOLIC PANEL     Status: Abnormal   Collection Time   01/17/12  4:59 PM      Component Value Range Comment   Sodium 135  135 - 145 mEq/L    Potassium 4.3  3.5 - 5.1 mEq/L    Chloride 95 (*) 96 - 112 mEq/L    CO2 28  19 - 32 mEq/L    Glucose, Bld 92  70 - 99 mg/dL    BUN 10  6 - 23 mg/dL    Creatinine, Ser 6.96  0.50 - 1.35 mg/dL    Calcium 9.0  8.4 - 29.5 mg/dL    Total Protein 7.3  6.0 - 8.3 g/dL    Albumin 4.0  3.5 - 5.2 g/dL    AST 284 (*) 0 - 37 U/L    ALT 131 (*) 0 - 53 U/L    Alkaline Phosphatase 85  39 - 117 U/L    Total Bilirubin 0.5  0.3 - 1.2 mg/dL    GFR calc non Af Amer >90  >90 mL/min    GFR calc Af Amer >90  >90 mL/min   ETHANOL     Status: Abnormal   Collection Time   01/17/12  4:59 PM      Component Value Range Comment   Alcohol, Ethyl (B) 140 (*) 0 - 11 mg/dL     Physical Findings: AIMS: Facial and Oral Movements Muscles of Facial Expression: None, normal Lips and Perioral Area: None, normal Jaw: None, normal Tongue: None, normal,Extremity Movements Upper (arms, wrists, hands, fingers): None, normal Lower (legs, knees,  ankles, toes): None, normal, Trunk Movements Neck, shoulders, hips: None, normal, Overall Severity Severity of abnormal movements (highest score from questions above): None, normal Incapacitation due to abnormal movements: None, normal Patient's awareness of abnormal movements (rate only patient's report): No Awareness, Dental Status Current  problems with teeth and/or dentures?: No Does patient usually wear dentures?: Yes  CIWA:  CIWA-Ar Total: 3  COWS:  COWS Total Score: 2   Treatment Plan Summary: Daily contact with patient to assess and evaluate symptoms and progress in treatment Medication management  Plan: Continue detox. Will like to be considered for D/C during the weekend, once the detox is completed  Madgeline Rayo A 01/19/2012, 2:06 PM

## 2012-01-19 NOTE — Tx Team (Signed)
Interdisciplinary Treatment Plan Update (Adult)  Date:  01/19/2012  Time Reviewed:  9:48 AM   Progress in Treatment: Attending groups: Yes Participating in groups:  Yes Taking medication as prescribed: Yes Tolerating medication:  Yes Family/Significant othe contact made:  SW assessing for appropriate contact Patient understands diagnosis:  Yes Discussing patient identified problems/goals with staff:  Yes Medical problems stabilized or resolved:  Yes Denies suicidal/homicidal ideation: Yes Issues/concerns per patient self-inventory:  None identified Other: N/A  New problem(s) identified: None Identified  Reason for Continuation of Hospitalization: Anxiety Depression Medication stabilization  Interventions implemented related to continuation of hospitalization: mood stabilization, medication monitoring and adjustment, group therapy and psycho education, safety checks q 15 mins  Additional comments: N/A  Estimated length of stay: 3-5 days  Discharge Plan: SW is assessing for appropriate referrals.    New goal(s): N/A  Review of initial/current patient goals per problem list:    1.  Goal(s): Address substance use  Met:  No  Target date: by discharge  As evidenced by: completing detox protocol and refer to appropriate treatment  2.  Goal (s): Reduce depressive and anxiety symptoms  Met:  No  Target date: by discharge  As evidenced by: Reducing depression from a 10 to a 3 as reported by pt.     Attendees: Patient:     Family:     Physician: Geoffery Lyons, MD 01/19/2012 9:48 AM   Nursing: Alease Frame, RN 01/19/2012 9:48 AM   Clinical Social Worker:  Reyes Ivan, LCSWA 01/19/2012  9:48 AM   Other: Stephannie Li, RN 01/19/2012  9:48 AM   Other:  Ashley Jacobs, LCSW 01/19/2012 9:49 AM   Other:     Other:     Other:      Scribe for Treatment Team:   Reyes Ivan 01/19/2012 9:48 AM

## 2012-01-20 NOTE — Progress Notes (Signed)
Psychoeducational Group Note  Date:  01/20/2012 Time:1000am  Group Topic/Focus:  Identifying Needs:   The focus of this group is to help patients identify their personal needs that have been historically problematic and identify healthy behaviors to address their needs.  Participation Level:  Active  Participation Quality:  Appropriate  Affect:  Appropriate  Cognitive:  Appropriate  Insight:  Good  Engagement in Group:  Good  Additional Comments:    Greg Lewis 01/20/2012,2:14 PM

## 2012-01-20 NOTE — Progress Notes (Signed)
D: Pt in bed resting with eyes closed. Respirations even and unlabored. Pt appears to be in no signs of distress at this time. A: Q15min checks remains for this pt. R: Pt remains safe at this time.   

## 2012-01-20 NOTE — Clinical Social Work Psych Note (Signed)
[facility] Group Notes:  (Counselor/Nursing/MHT/Case Management/Adjunct)  01/20/2012  12:33 PM Type of Therapy:  Group Therapy  Participation Level:  Active  Participation Quality:  Appropriate  Affect:  Appropriate  Cognitive:  Alert  Insight:  Good  Engagement in Group:  Good  Engagement in Therapy:  Good  Modes of Intervention:  Socialization, Support, Clarification and Education  Summary of Progress/Problems:The process group therapy session focused on self-sabotaging and enabling behaviors. The patients were guided to identify and discuss how these behaviors related to their individual situations and how to limit the behaviors in their personal lives. The patient reported that he overworked himself and also drink to numb physical pain.    Kerin Cecchi Claudette Laws, [place] 01/20/2012 12:33 PM

## 2012-01-20 NOTE — Progress Notes (Signed)
Patient did attend the evening speaker AA meeting.  

## 2012-01-20 NOTE — Progress Notes (Signed)
Patient ID: Greg Lewis, male   DOB: 1950-09-24, 61 y.o.   MRN: [id number] 01-20-12 @ 1313 nursing shift note: D: pt stated he is not having any type of w/d symptoms, nor any pain  He is also hoh. A: rn spoke with pt individually and he stated he was doing "ok". Projected my voice so that he could hear due to hearing problems R: rn made himself available to the pt. Will continue to monitor and q 15 min cks continue.

## 2012-01-20 NOTE — Progress Notes (Signed)
[facility] MD Progress Note  01/20/2012 5:41 PM  Diagnosis:  Alcohol dependence   ADL's:  Intact  Sleep: Good  Appetite:  Good  Suicidal Ideation:  None  Homicidal Ideation:  None   AEB (as evidenced by): Relapsed about a month ago due to severe L hip pain. Knows he needs to get it evaluated. Proud that he stayed sober almost a year.Very fine hand tremor.  Mental Status Examination/Evaluation: Objective:  Appearance: Fairly Groomed  Eye Contact: Good  Speech:  Clear and Coherent and Normal Rate  Volume:  Normal  Mood:  Euthymic  Affect:  Congruent  Thought Process:  Goal Directed, Intact and Linear  Orientation:  Full  Thought Content:  No AVH/psychosis   Suicidal Thoughts:  No  Homicidal Thoughts:  No  Memory:  Immediate;   Good  Judgement:  Intact  Insight:  Good  Psychomotor Activity:  Normal  Concentration:  Good  Recall:  Good  Akathisia:  No  Handed:  Right  AIMS (if indicated):     Assets:  Communication Skills Desire for Improvement Physical Health Resilience Social Support  Sleep:  Number of Hours: 4.5    Vital Signs:Blood pressure 125/89, pulse 100, temperature 97.4 F (36.3 C), temperature source Oral, resp. rate 17, height 5\' 2"  (1.575 m). Current Medications: Current Facility-Administered Medications  Medication Dose Route Frequency Provider Last Rate Last Dose  . acetaminophen (TYLENOL) tablet 650 mg  650 mg Oral Q6H PRN Jorje Guild, PA-C      . alum & mag hydroxide-simeth (MAALOX/MYLANTA) 200-200-20 MG/5ML suspension 30 mL  30 mL Oral Q4H PRN Jorje Guild, PA-C      . chlordiazePOXIDE (LIBRIUM) capsule 25 mg  25 mg Oral Q6H PRN Jorje Guild, PA-C      . chlordiazePOXIDE (LIBRIUM) capsule 25 mg  25 mg Oral BH-qamhs Jorje Guild, PA-C   25 mg at 01/20/12 0809   Followed by  . chlordiazePOXIDE (LIBRIUM) capsule 25 mg  25 mg Oral Daily Jorje Guild, PA-C      . feeding supplement (ENSURE COMPLETE) liquid 237 mL  237 mL Oral BID BM Anastasio Champion, RD   237 mL at  01/20/12 0811  . hydrochlorothiazide (MICROZIDE) capsule 12.5 mg  12.5 mg Oral Daily Jorje Guild, PA-C   12.5 mg at 01/20/12 0809  . hydrOXYzine (ATARAX/VISTARIL) tablet 25 mg  25 mg Oral Q6H PRN Jorje Guild, PA-C      . lisinopril (PRINIVIL,ZESTRIL) tablet 10 mg  10 mg Oral Daily Jorje Guild, PA-C   10 mg at 01/20/12 0810  . loperamide (IMODIUM) capsule 2-4 mg  2-4 mg Oral PRN Jorje Guild, PA-C      . magnesium hydroxide (MILK OF MAGNESIA) suspension 30 mL  30 mL Oral Daily PRN Jorje Guild, PA-C      . multivitamin with minerals tablet 1 tablet  1 tablet Oral Daily Jorje Guild, PA-C   1 tablet at 01/20/12 0809  . nicotine (NICODERM CQ - dosed in mg/24 hours) patch 21 mg  21 mg Transdermal Daily Rachael Fee, MD   21 mg at 01/20/12 1610  . ondansetron (ZOFRAN-ODT) disintegrating tablet 4 mg  4 mg Oral Q6H PRN Jorje Guild, PA-C      . thiamine (B-1) injection 100 mg  100 mg Intramuscular Once Jorje Guild, PA-C      . thiamine (VITAMIN B-1) tablet 100 mg  100 mg Oral Daily Jorje Guild, PA-C   100 mg at 01/20/12 0809  . traZODone (DESYREL) tablet  50 mg  50 mg Oral QHS PRN Jorje Guild, PA-C   50 mg at 01/19/12 2304    Lab Results: No results found for this or any previous visit (from the past 48 hour(s)).  Physical Findings: AIMS: Facial and Oral Movements Muscles of Facial Expression: None, normal Lips and Perioral Area: None, normal Jaw: None, normal Tongue: None, normal,Extremity Movements Upper (arms, wrists, hands, fingers): None, normal Lower (legs, knees, ankles, toes): None, normal, Trunk Movements Neck, shoulders, hips: None, normal, Overall Severity Severity of abnormal movements (highest score from questions above): None, normal Incapacitation due to abnormal movements: None, normal Patient's awareness of abnormal movements (rate only patient's report): No Awareness, Dental Status Current problems with teeth and/or dentures?: No Does patient usually wear dentures?: Yes  CIWA:  CIWA-Ar Total: 0    COWS:  COWS Total Score: 2   Treatment Plan Summary: Daily contact with patient to assess and evaluate symptoms and progress in treatment Medication management  Plan: Continue current plan of care.  Hillari Zumwalt,MICKIE D. 01/20/2012, 5:41 PM

## 2012-01-21 NOTE — Progress Notes (Signed)
Psychoeducational Group Note  Date:  01/21/2012 Time:  1315pm  Group Topic/Focus:  Making Healthy Choices:   The focus of this group is to help patients identify negative/unhealthy choices they were using prior to admission and identify positive/healthier coping strategies to replace them upon discharge.  Participation Level:  Active  Participation Quality:  Appropriate  Affect:  Appropriate  Cognitive:  Appropriate  Insight:  Good  Engagement in Group:  Good  Additional Comments:    Valente David 01/21/2012,2:01 PM

## 2012-01-21 NOTE — Progress Notes (Signed)
Met with pt 1:1 who reports having a good day. He is denying W/D symptoms and has a fairly good understanding of librium protocol. He denies any other issues. Attended AA this evening. Medicated per orders. Emotional support given. Pt asking about Ensure as he recognizes he has had poor nutrition. Information provided. He denies SI/HI/AVH. No prn's requested or given. CIWA - "0". Greg Lewis

## 2012-01-21 NOTE — Progress Notes (Signed)
D: Pt brightens on approach. Pt is very HOH & states that his goal after d/c is to get a hearing aid.Pt denies any w/d s/s.Pt is pleasant & cooperative ,attending groups.Reports that he has had a great day.A: Continues on 15 minute checks. R: Pt. Safety maintained.

## 2012-01-21 NOTE — Progress Notes (Signed)
  Greg Lewis is a 61 y.o. male [id number] [birth year]  01/18/2012 Principal Problem:  *Alcohol dependence Active Problems:  Elevated liver enzymes   Mental Status: Mood is bright cheerful denies SI/HI/AVH.    Subjective/Objective: No longer has any withdrawal. Last felt a little light headed yesterday. Asks if he should tell his wife to come around 1pm tomorrow for discharge? Told him that sounded like a plan. Still planning to get L hip evaluated.   Filed Vitals:   01/21/12 0701  BP: 163/99  Pulse: 116  Temp:   Resp:     Lab Results:   BMET    Component Value Date/Time   NA 135 01/17/2012 1659   K 4.3 01/17/2012 1659   CL 95* 01/17/2012 1659   CO2 28 01/17/2012 1659   GLUCOSE 92 01/17/2012 1659   BUN 10 01/17/2012 1659   CREATININE 0.73 01/17/2012 1659   CALCIUM 9.0 01/17/2012 1659   GFRNONAA >90 01/17/2012 1659   GFRAA >90 01/17/2012 1659    Medications:  Scheduled:     . chlordiazePOXIDE  25 mg Oral BH-qamhs   Followed by  . chlordiazePOXIDE  25 mg Oral Daily  . feeding supplement  237 mL Oral BID BM  . hydrochlorothiazide  12.5 mg Oral Daily  . lisinopril  10 mg Oral Daily  . multivitamin with minerals  1 tablet Oral Daily  . nicotine  21 mg Transdermal Daily  . thiamine  100 mg Intramuscular Once  . thiamine  100 mg Oral Daily     PRN Meds acetaminophen, alum & mag hydroxide-simeth, chlordiazePOXIDE, hydrOXYzine, loperamide, magnesium hydroxide, ondansetron, traZODone Plan: continue current plan of care.   Samanthamarie Ezzell,MICKIE D. 01/21/2012

## 2012-01-21 NOTE — Progress Notes (Signed)
Psychoeducational Group Note  Date:  01/21/2012 Time: 1515  Group Topic/Focus:  Wellness Toolbox:   The focus of this group is to discuss various aspects of wellness, balancing those aspects and exploring ways to increase the ability to experience wellness.  Patients will create a wellness toolbox for use upon discharge.  Participation Level:  Active  Participation Quality:  Appropriate  Affect:  Appropriate  Cognitive:  Appropriate  Insight:  Good  Engagement in Group:  Limited  Additional Comments:    Maci Eickholt H 01/21/2012, 4:53 PM

## 2012-01-21 NOTE — Progress Notes (Signed)
Agree and sign. 

## 2012-01-22 MED ORDER — TRAZODONE HCL 50 MG PO TABS: 50.0000 mg | ORAL_TABLET | Freq: Every evening | ORAL | Status: DC | PRN

## 2012-01-22 NOTE — Progress Notes (Signed)
D. Pt resting. Eyes closed. Respirations even and unlabored. A: Continue Q 15 min checks for pt safety. R: Pt remains safe on the unit.   

## 2012-01-22 NOTE — [facility] Suicide Risk Assessment (Signed)
Suicide Risk Assessment  Discharge Assessment     Demographic Factors:  Male and Caucasian  Mental Status Per Nursing Assessment::   On Admission:  NA  Current Mental Status by Physician: In full contact with reality. There are no suicidal ideas, plans or intent. He is fully detox. Committed to abstinence  Loss Factors: NA  Historical Factors: NA  Risk Reduction Factors:   Sense of responsibility to family, Employed, Living with another person, especially a relative and Positive social support  Continued Clinical Symptoms:  Alcohol/Substance Abuse/Dependencies  Cognitive Features That Contribute To Risk: None  Suicide Risk: Minimal  Discharge Diagnoses:   AXIS I:  Alcohol Dependence AXIS II:  Deferred AXIS III:   Past Medical History  Diagnosis Date  . Hypertension   . ETOH abuse    AXIS IV:  None identified AXIS V:  61-70 mild symptoms  Plan Of Care/Follow-up recommendations:  Activity:  As tolerated Diet:  Regular  Is patient on multiple antipsychotic therapies at discharge:  No   Has Patient had three or more failed trials of antipsychotic monotherapy by history:  No  Recommended Plan for Multiple Antipsychotic Therapies: N/A  Greg Lewis A 01/22/2012, 12:01 PM

## 2012-01-22 NOTE — Progress Notes (Signed)
Discharge Note: Patient discharged to self. Patient wife and daughter attended session to discuss discharge instructions. Patient and family received verbal instructions and patient received written instructions. Patient agreed to f/u appointments and agreed to follow drug regimen. Patient received a two week supply of nicotine transdermal patch, requested by patient. Patient denies SI/HI/AVH. Patient denies pain and show no s/s of distress. Patient received all belongings from locker. Patient thanked staff for their support and exited the facility with wife and daughter.

## 2012-01-22 NOTE — Progress Notes (Signed)
[facility] Case Management Discharge Plan:  Will you be returning to the same living situation after discharge: Yes,  returning home At discharge, do you have transportation home?:Yes,  wife will pick pt up Do you have the ability to pay for your medications:Yes,  access to meds  Release of information consent forms completed and in the chart;  Patient's signature needed at discharge.  Patient to Follow up at:  Follow-up Information    Follow up with [facility]. On 01/25/2012. (Walk in between 7:45 am - 7:30 pm)    Contact information:   [address] [place], [place] [address] [phone]      Follow up with AA meetings. (See printout for meeting times and locations)          Patient denies SI/HI:   Yes,  denies SI/HI    Safety Planning and Suicide Prevention discussed:  Yes,  discussed with pt today  Barrier to discharge identified:No.  Summary and Recommendations: Pt attended discharge planning group and actively participated in group.  SW provided pt with today's workbook.  Pt presents with calm mood and affect.  Pt rates depression and anxiety at a 0 today.  Pt denies SI/HI.  Pt reports feeling stable to d/c today.  No recommendations from SW.  No further needs voiced by pt.  Pt stable to discharge.       Carmina Miller 01/22/2012, 10:16 AM

## 2012-01-22 NOTE — Discharge Summary (Signed)
Physician Discharge Summary Note  Patient:  Greg Lewis is an 61 y.o., male MRN:  [id number] DOB:  [birth year] Patient phone:  [phone] (home)  Patient address:   [address] [place] [address],   Date of Admission:  01/18/2012 Date of Discharge: 01/22/2012  Reason for Admission:  Alcohol Detoxification  Discharge Diagnoses: Principal Problem:  *Alcohol dependence Active Problems:  Elevated liver enzymes   Axis Diagnosis:   AXIS I:  Alcohol Abuse AXIS II:  Deferred AXIS III:   Past Medical History  Diagnosis Date  . Hypertension   . ETOH abuse    AXIS IV:  other psychosocial or environmental problems, problems related to social environment and problems with primary support group AXIS V:  61-70 mild symptoms  Level of Care:  OP  Hospital Course:   Patient attended individual and group therapy during inpatient along with attending AA groups, one-one time with MD daily, medications for detox managed during inpatient, follow-up appointments made prior to discharge, encouraged to attend AA meetings to continue his sobriety  Consults:  None  Significant Diagnostic Studies:  labs: Completed in ED, reviewed  Discharge Vitals:   Blood pressure 171/112, pulse 115, temperature 97 F (36.1 C), temperature source Oral, resp. rate 20, height 5\' 2"  (1.575 m).  Physical Findings: AIMS: Facial and Oral Movements Muscles of Facial Expression: None, normal Lips and Perioral Area: None, normal Jaw: None, normal Tongue: None, normal,Extremity Movements Upper (arms, wrists, hands, fingers): None, normal Lower (legs, knees, ankles, toes): None, normal, Trunk Movements Neck, shoulders, hips: None, normal, Overall Severity Severity of abnormal movements (highest score from questions above): None, normal Incapacitation due to abnormal movements: None, normal Patient's awareness of abnormal movements (rate only patient's report): No Awareness, Dental Status Current problems  with teeth and/or dentures?: No Does patient usually wear dentures?: Yes  CIWA:  CIWA-Ar Total: 0  COWS:  COWS Total Score: 2   Mental Status Exam: See Mental Status Examination and Suicide Risk Assessment completed by Attending Physician prior to discharge.  Discharge destination:  Home  Is patient on multiple antipsychotic therapies at discharge:  No   Has Patient had three or more failed trials of antipsychotic monotherapy by history:  N/A Recommended Plan for Multiple Antipsychotic Therapies:  N/A   Discharge Orders    Future Orders Please Complete By Expires   Diet - low sodium heart healthy      Activity as tolerated - No restrictions          Medication List     As of 01/22/2012  9:04 AM    STOP taking these medications         ibuprofen 200 MG tablet   Commonly known as: ADVIL,MOTRIN      TAKE these medications      Indication    lisinopril-hydrochlorothiazide 10-12.5 MG per tablet   Commonly known as: PRINZIDE,ZESTORETIC   Take 1 tablet by mouth daily.    Indication: High Blood Pressure      traZODone 50 MG tablet   Commonly known as: DESYREL   Take 1 tablet (50 mg total) by mouth at bedtime as needed for sleep (may repeat  x1 if needed).    Indication: Trouble Sleeping        Follow-up recommendations:  Activity as tolerated, low sodium heart healthy diet, take blood pressure medications daily (patient education in place regarding the dangers of suddenly discontinuation of these medications), Thera-M multivitamin recommended by pharmacist to answer the patient's request for a  multivitamin selection after discharge  Comments:  Patient denied suicidal/homicidal ideations and auditory/visual hallucinations at discharge assessment Signed: Nanine Means 01/22/2012, 9:04 AM

## 2012-01-22 NOTE — Progress Notes (Signed)
[facility] Adult Inpatient Family/Significant Other Suicide Prevention Education  Suicide Prevention Education:  Education Completed; Greg Lewis - wife (completed in person),  (name of family member/significant other) has been identified by the patient as the family member/significant other with whom the patient will be residing, and identified as the person(s) who will aid the patient in the event of a mental health crisis (suicidal ideations/suicide attempt).  With written consent from the patient, the family member/significant other has been provided the following suicide prevention education, prior to the and/or following the discharge of the patient.  The suicide prevention education provided includes the following:  Suicide risk factors  Suicide prevention and interventions  National Suicide Hotline telephone number  [facility] assessment telephone number  [facility] Emergency Assistance 911  [facility] and/or Residential Mobile Crisis Unit telephone number  Request made of family/significant other to:  Remove weapons (e.g., guns, rifles, knives), all items previously/currently identified as safety concern.    Remove drugs/medications (over-the-counter, prescriptions, illicit drugs), all items previously/currently identified as a safety concern.  The family member/significant other verbalizes understanding of the suicide prevention education information provided.  The family member/significant other agrees to remove the items of safety concern listed above.  Greg Lewis 01/22/2012, 12:02 PM

## 2012-01-22 NOTE — Tx Team (Signed)
Interdisciplinary Treatment Plan Update (Adult)  Date:  01/22/2012  Time Reviewed:  9:52 AM   Progress in Treatment: Attending groups: Yes Participating in groups:  Yes Taking medication as prescribed: Yes Tolerating medication:  Yes Family/Significant othe contact made:  CSW will talk with wife today Patient understands diagnosis:  Yes Discussing patient identified problems/goals with staff:  Yes Medical problems stabilized or resolved:  Yes Denies suicidal/homicidal ideation: Yes Issues/concerns per patient self-inventory:  None identified Other: N/A  New problem(s) identified: None Identified  Reason for Continuation of Hospitalization: Stable to d/c  Interventions implemented related to continuation of hospitalization: Stable to d/c  Additional comments: N/A  Estimated length of stay: D/C today  Discharge Plan: Pt will follow up with his PCP and AA meetings.    New goal(s): N/A  Review of initial/current patient goals per problem list:    1.  Goal(s): Address substance use  Met:  Yes  Target date: by discharge  As evidenced by: completed detox protocol and referred to appropriate treatment  2.  Goal (s): Reduce depressive and anxiety symptoms  Met:  Yes  Target date: by discharge  As evidenced by: Reducing depression from a 10 to a 3 as reported by pt.  Pt rates at a 0 today.    Attendees: Patient:  Greg Lewis  01/22/2012 9:52 AM   Family:     Physician:  Geoffery Lyons, MD 01/22/2012 9:52 AM   Nursing: Roswell Miners, RN 01/22/2012 9:52 AM   Clinical Social Worker:  Reyes Ivan, LCSWA 01/22/2012 9:52 AM   Other: Bubba Camp, Psyc intern 01/22/2012 9:52 AM   Other:  Alease Frame, RN 01/22/2012 9:53 AM   Other:  Jules Schick, RN 01/22/2012 9:53 AM   Other:  Liborio Nixon, RN 01/22/2012 9:53 AM   Other:      Scribe for Treatment Team:   Reyes Ivan 01/22/2012 9:52 AM

## 2012-01-23 NOTE — Progress Notes (Signed)
Patient Discharge Instructions:  After Visit Summary (AVS):   Faxed to:  01/23/2012 Psychiatric Admission Assessment Note:   Faxed to:  01/23/2012 Suicide Risk Assessment - Discharge Assessment:   Faxed to:  01/23/2012 Faxed/Sent to the Next Level Care provider:  01/23/2012  Faxed to [facility] @ [phone]  Wandra Scot, 01/23/2012, 2:34 PM

## 2012-01-24 NOTE — H&P (Signed)
Agree with assessment and plan Minette Manders A. Gian Ybarra, M.D. 

## 2012-02-04 NOTE — Discharge Summary (Signed)
Agree with assessment and plan Nomi Rudnicki A. Sumayya Muha, M.D. 

## 2014-01-19 ENCOUNTER — Emergency Department (HOSPITAL_COMMUNITY)
Admission: EM | Admit: 2014-01-19 | Discharge: 2014-01-20 | Disposition: A | Discharge status: Other Acute Inpatient Hospital | Payer: PRIVATE HEALTH INSURANCE | Attending: Emergency Medicine | Admitting: Emergency Medicine

## 2014-01-19 ENCOUNTER — Encounter (HOSPITAL_COMMUNITY): Payer: Self-pay | Admitting: Emergency Medicine

## 2014-01-19 DIAGNOSIS — Z85038 Personal history of other malignant neoplasm of large intestine: Secondary | ICD-10-CM | POA: Insufficient documentation

## 2014-01-19 DIAGNOSIS — I10 Essential (primary) hypertension: Secondary | ICD-10-CM | POA: Insufficient documentation

## 2014-01-19 DIAGNOSIS — Y908 Blood alcohol level of 240 mg/100 ml or more: Secondary | ICD-10-CM | POA: Insufficient documentation

## 2014-01-19 DIAGNOSIS — F10220 Alcohol dependence with intoxication, uncomplicated: Secondary | ICD-10-CM

## 2014-01-19 DIAGNOSIS — R112 Nausea with vomiting, unspecified: Secondary | ICD-10-CM | POA: Insufficient documentation

## 2014-01-19 DIAGNOSIS — F10229 Alcohol dependence with intoxication, unspecified: Secondary | ICD-10-CM | POA: Insufficient documentation

## 2014-01-19 DIAGNOSIS — F17210 Nicotine dependence, cigarettes, uncomplicated: Secondary | ICD-10-CM | POA: Insufficient documentation

## 2014-01-19 HISTORY — DX: Malignant (primary) neoplasm, unspecified: C80.1

## 2014-01-19 LAB — COMPREHENSIVE METABOLIC PANEL
ALBUMIN: 4 g/dL (ref 3.5–5.2)
ALK PHOS: 117 U/L (ref 39–117)
ALT: 44 U/L (ref 0–53)
ANION GAP: 15 (ref 5–15)
AST: 101 U/L — ABNORMAL HIGH (ref 0–37)
BILIRUBIN TOTAL: 0.6 mg/dL (ref 0.3–1.2)
BUN: 7 mg/dL (ref 6–23)
CHLORIDE: 99 meq/L (ref 96–112)
CO2: 27 meq/L (ref 19–32)
CREATININE: 0.66 mg/dL (ref 0.50–1.35)
Calcium: 8.6 mg/dL (ref 8.4–10.5)
GFR calc Af Amer: >90 mL/min (ref >90)
Glucose, Bld: 77 mg/dL (ref 70–99)
POTASSIUM: 4.2 meq/L (ref 3.7–5.3)
Sodium: 141 mEq/L (ref 137–147)
Total Protein: 7.7 g/dL (ref 6.0–8.3)

## 2014-01-19 LAB — RAPID URINE DRUG SCREEN, HOSP PERFORMED
Amphetamines: NOT DETECTED
BARBITURATES: NOT DETECTED
BENZODIAZEPINES: NOT DETECTED
COCAINE: NOT DETECTED
Opiates: NOT DETECTED
TETRAHYDROCANNABINOL: NOT DETECTED

## 2014-01-19 LAB — CBC
HCT: 50.2 % (ref 39.0–52.0)
Hemoglobin: 17.7 g/dL — ABNORMAL HIGH (ref 13.0–17.0)
MCH: 34 pg (ref 26.0–34.0)
MCHC: 35.3 g/dL (ref 30.0–36.0)
MCV: 96.5 fL (ref 78.0–100.0)
Platelets: 90 10*3/uL — ABNORMAL LOW (ref 150–400)
RBC: 5.2 MIL/uL (ref 4.22–5.81)
RDW: 14.1 % (ref 11.5–15.5)
WBC: 5.2 10*3/uL (ref 4.0–10.5)

## 2014-01-19 LAB — SALICYLATE LEVEL: Salicylate Lvl: <2 mg/dL — ABNORMAL LOW (ref 2.8–20.0)

## 2014-01-19 LAB — ETHANOL: Alcohol, Ethyl (B): 312 mg/dL — ABNORMAL HIGH (ref 0–11)

## 2014-01-19 LAB — ACETAMINOPHEN LEVEL: Acetaminophen (Tylenol), Serum: <15 ug/mL (ref 10–30)

## 2014-01-19 MED ORDER — VITAMIN B-1 100 MG PO TABS
100.0000 mg | ORAL_TABLET | Freq: Every day | ORAL | Status: DC
Start: 2014-01-20 — End: 2014-01-20
  Administered 2014-01-20: 100 mg via ORAL
  Filled 2014-01-19: qty 1

## 2014-01-19 MED ORDER — THIAMINE HCL 100 MG/ML IJ SOLN: 100.0000 mg | Freq: Every day | INTRAMUSCULAR | Status: DC

## 2014-01-19 MED ORDER — LORAZEPAM 1 MG PO TABS
0.0000 mg | ORAL_TABLET | Freq: Four times a day (QID) | ORAL | Status: DC
  Administered 2014-01-20: 1 mg via ORAL
  Filled 2014-01-19 (×2): qty 1

## 2014-01-19 MED ORDER — IBUPROFEN 200 MG PO TABS: 600.0000 mg | ORAL_TABLET | Freq: Three times a day (TID) | ORAL | Status: DC | PRN

## 2014-01-19 MED ORDER — HYDROCHLOROTHIAZIDE 25 MG PO TABS
25.0000 mg | ORAL_TABLET | Freq: Every day | ORAL | Status: DC
  Administered 2014-01-20: 25 mg via ORAL
  Filled 2014-01-19: qty 1

## 2014-01-19 MED ORDER — LISINOPRIL 20 MG PO TABS
20.0000 mg | ORAL_TABLET | Freq: Every day | ORAL | Status: DC
  Administered 2014-01-20: 20 mg via ORAL
  Filled 2014-01-19: qty 1

## 2014-01-19 MED ORDER — LORAZEPAM 1 MG PO TABS: 0.0000 mg | ORAL_TABLET | Freq: Two times a day (BID) | ORAL | Status: DC

## 2014-01-19 MED ORDER — LISINOPRIL-HYDROCHLOROTHIAZIDE 20-25 MG PO TABS: 1.0000 | ORAL_TABLET | Freq: Every day | ORAL | Status: DC

## 2014-01-19 MED ORDER — NICOTINE 21 MG/24HR TD PT24
21.0000 mg | MEDICATED_PATCH | Freq: Every day | TRANSDERMAL | Status: DC
Start: 2014-01-20 — End: 2014-01-20
  Administered 2014-01-20: 21 mg via TRANSDERMAL
  Filled 2014-01-19: qty 1

## 2014-01-19 MED ORDER — ONDANSETRON HCL 4 MG PO TABS: 4.0000 mg | ORAL_TABLET | Freq: Three times a day (TID) | ORAL | Status: DC | PRN

## 2014-01-19 MED ORDER — ALUM & MAG HYDROXIDE-SIMETH 200-200-20 MG/5ML PO SUSP: 30.0000 mL | ORAL | Status: DC | PRN

## 2014-01-19 NOTE — ED Notes (Signed)
Family reports that pt has been drinking for 2 weeks; family reports N/V and decreased intake other than ETOH; pt requesting detox; pt denies SI / HI

## 2014-01-19 NOTE — ED Notes (Signed)
Pt has been wanded by security. 

## 2014-01-19 NOTE — ED Provider Notes (Signed)
CSN: [id number]     Arrival date & time 01/19/14  2012 History  This chart was scribed for non-physician practitioner working with Virgel Manifold, MD by Mercy Moore, ED Scribe. This patient was seen in room WTR4/WLPT4 and the patient's care was started at 10:11 PM.   Chief Complaint  Patient presents with  . Alcohol Problem   The history is provided by the patient and a relative. No language interpreter was used.   HPI Comments: Greg Lewis is a 63 y.o. male who presents to the Emergency Department requesting assistance with alcohol detoxification. Family reports that patient has been drinking persistently for 30 days now. Patient reports previous history of alcohol abuse. Patient has been nausea and vomiting and hasn't been eating well; he however has continued consumption of alcohol. Patient's drink of choice is beer: he reports drinking a 12 pack each day.  Patient reports tobacco use.   Patient reports PMHx of Hypertension and history of colon cancer. He recent denies rectal bleeding or diarrhea.   Past Medical History  Diagnosis Date  . Hypertension   . ETOH abuse   . Cancer     colon   Past Surgical History  Procedure Laterality Date  . Colonoscopy w/ biopsies and polypectomy     No family history on file. History  Substance Use Topics  . Smoking status: Current Every Day Smoker -- 1.50 packs/day    Types: Cigarettes  . Smokeless tobacco: Not on file  . Alcohol Use: 50.4 oz/week    84 Cans of beer per week    Review of Systems  Constitutional: Negative for fever, chills and appetite change.  HENT: Negative for rhinorrhea and sore throat.   Eyes: Negative for redness.  Respiratory: Negative for cough.   Cardiovascular: Negative for chest pain.  Gastrointestinal: Positive for nausea and vomiting. Negative for abdominal pain, diarrhea and blood in stool.  Genitourinary: Negative for dysuria.  Musculoskeletal: Negative for myalgias.  Skin: Negative for rash.   Neurological: Negative for headaches.      Allergies  Cabbage  Home Medications   Prior to Admission medications   Medication Sig Start Date End Date Taking? Authorizing Provider  lisinopril-hydrochlorothiazide (PRINZIDE,ZESTORETIC) 20-25 MG per tablet Take 1 tablet by mouth daily.   Yes Historical Provider, MD  Multiple Vitamin (MULTIVITAMIN WITH MINERALS) TABS tablet Take 1 tablet by mouth daily.   Yes Historical Provider, MD   Triage Vitals: BP 181/99  Pulse 87  Temp(Src) 97.6 F (36.4 C) (Oral)  Resp 20  Ht 5\' 9"  (1.753 m)  Wt 145 lb (65.772 kg)  BMI 21.40 kg/m2  SpO2 96%  Physical Exam  Nursing note and vitals reviewed. Constitutional: He is oriented to person, place, and time. He appears well-developed and well-nourished. No distress.  HENT:  Head: Normocephalic and atraumatic.  Eyes: Conjunctivae and EOM are normal. Right eye exhibits no discharge. Left eye exhibits no discharge.  Neck: Normal range of motion. Neck supple. No tracheal deviation present.  Cardiovascular: Normal rate, regular rhythm and normal heart sounds.   Pulmonary/Chest: Effort normal and breath sounds normal. No respiratory distress.  Abdominal: Soft. There is no tenderness.  Musculoskeletal: Normal range of motion.  Neurological: He is alert and oriented to person, place, and time. He has normal strength. No cranial nerve deficit or sensory deficit. Coordination abnormal. GCS eye subscore is 4. GCS verbal subscore is 5. GCS motor subscore is 6.  Patient with slurred speech, he is clinically intoxicated.  Skin: Skin is warm  and dry.  Psychiatric: He has a normal mood and affect. His behavior is normal.    ED Course  Procedures (including critical care time) Labs Review Labs Reviewed  CBC - Abnormal; Notable for the following:    Hemoglobin 17.7 (*)    Platelets 90 (*)    All other components within normal limits  COMPREHENSIVE METABOLIC PANEL - Abnormal; Notable for the following:     AST 101 (*)    All other components within normal limits  ETHANOL - Abnormal; Notable for the following:    Alcohol, Ethyl (B) 312 (*)    All other components within normal limits  SALICYLATE LEVEL - Abnormal; Notable for the following:    Salicylate Lvl <1.7 (*)    All other components within normal limits  ACETAMINOPHEN LEVEL  URINE RAPID DRUG SCREEN (HOSP PERFORMED)    Imaging Review No results found.   EKG Interpretation None      Patient seen and examined. Work-up initiated. Medications ordered.   Vital signs reviewed and are as follows: BP 181/99  Pulse 87  Temp(Src) 97.6 F (36.4 C) (Oral)  Resp 20  Ht 5\' 9"  (1.753 m)  Wt 145 lb (65.772 kg)  BMI 21.40 kg/m2  SpO2 96%  12:38 AM Patient is medically cleared. Pending TTS consult.  MDM   Final diagnoses:  Alcohol dependence with uncomplicated intoxication   Pending TTS consult.   I personally performed the services described in this documentation, which was scribed in my presence. The recorded information has been reviewed and is accurate.    Carlisle Cater, PA-C 01/20/14 [phone]

## 2014-01-20 ENCOUNTER — Inpatient Hospital Stay (HOSPITAL_COMMUNITY)
Admission: AD | Admit: 2014-01-20 | Discharge: 2014-01-26 | DRG: 897 | Disposition: A | Discharge status: Home / Self Care | Payer: PRIVATE HEALTH INSURANCE | Source: Intra-hospital | Attending: Psychiatry | Admitting: Psychiatry

## 2014-01-20 ENCOUNTER — Encounter (HOSPITAL_COMMUNITY): Payer: Self-pay | Admitting: *Deleted

## 2014-01-20 ENCOUNTER — Encounter (HOSPITAL_COMMUNITY): Payer: Self-pay | Admitting: Behavioral Health

## 2014-01-20 DIAGNOSIS — R45851 Suicidal ideations: Secondary | ICD-10-CM | POA: Diagnosis present

## 2014-01-20 DIAGNOSIS — F1919 Other psychoactive substance abuse with unspecified psychoactive substance-induced disorder: Secondary | ICD-10-CM | POA: Diagnosis not present

## 2014-01-20 DIAGNOSIS — I10 Essential (primary) hypertension: Secondary | ICD-10-CM | POA: Diagnosis present

## 2014-01-20 DIAGNOSIS — F419 Anxiety disorder, unspecified: Secondary | ICD-10-CM | POA: Diagnosis present

## 2014-01-20 DIAGNOSIS — F10239 Alcohol dependence with withdrawal, unspecified: Principal | ICD-10-CM | POA: Diagnosis present

## 2014-01-20 DIAGNOSIS — Z599 Problem related to housing and economic circumstances, unspecified: Secondary | ICD-10-CM

## 2014-01-20 DIAGNOSIS — F17210 Nicotine dependence, cigarettes, uncomplicated: Secondary | ICD-10-CM | POA: Diagnosis present

## 2014-01-20 DIAGNOSIS — F1024 Alcohol dependence with alcohol-induced mood disorder: Secondary | ICD-10-CM | POA: Insufficient documentation

## 2014-01-20 DIAGNOSIS — Z559 Problems related to education and literacy, unspecified: Secondary | ICD-10-CM | POA: Diagnosis present

## 2014-01-20 DIAGNOSIS — F329 Major depressive disorder, single episode, unspecified: Secondary | ICD-10-CM | POA: Diagnosis present

## 2014-01-20 DIAGNOSIS — F1010 Alcohol abuse, uncomplicated: Secondary | ICD-10-CM | POA: Diagnosis present

## 2014-01-20 LAB — ETHANOL
ALCOHOL ETHYL (B): 213 mg/dL — AB (ref 0–11)
Alcohol, Ethyl (B): 83 mg/dL — ABNORMAL HIGH (ref 0–11)

## 2014-01-20 MED ORDER — LORAZEPAM 1 MG PO TABS
1.0000 mg | ORAL_TABLET | Freq: Three times a day (TID) | ORAL | Status: AC
  Administered 2014-01-22 (×3): 1 mg via ORAL
  Filled 2014-01-20 (×2): qty 1

## 2014-01-20 MED ORDER — MAGNESIUM HYDROXIDE 400 MG/5ML PO SUSP: 30.0000 mL | Freq: Every day | ORAL | Status: DC | PRN

## 2014-01-20 MED ORDER — LORAZEPAM 1 MG PO TABS
1.0000 mg | ORAL_TABLET | Freq: Four times a day (QID) | ORAL | Status: AC
  Administered 2014-01-20 – 2014-01-21 (×6): 1 mg via ORAL
  Filled 2014-01-20 (×6): qty 1

## 2014-01-20 MED ORDER — VITAMIN B-1 100 MG PO TABS
ORAL_TABLET | ORAL | Status: AC
  Filled 2014-01-20: qty 1

## 2014-01-20 MED ORDER — LORAZEPAM 1 MG PO TABS
1.0000 mg | ORAL_TABLET | Freq: Two times a day (BID) | ORAL | Status: AC
  Administered 2014-01-23 (×2): 1 mg via ORAL
  Filled 2014-01-20 (×3): qty 1

## 2014-01-20 MED ORDER — HYDROXYZINE HCL 25 MG PO TABS: 25.0000 mg | ORAL_TABLET | Freq: Four times a day (QID) | ORAL | Status: AC | PRN

## 2014-01-20 MED ORDER — LORAZEPAM 1 MG PO TABS
1.0000 mg | ORAL_TABLET | Freq: Every day | ORAL | Status: AC
  Administered 2014-01-24: 1 mg via ORAL
  Filled 2014-01-20: qty 1

## 2014-01-20 MED ORDER — VITAMIN B-1 100 MG PO TABS
100.0000 mg | ORAL_TABLET | Freq: Every day | ORAL | Status: DC
  Administered 2014-01-21 – 2014-01-26 (×6): 100 mg via ORAL
  Filled 2014-01-20 (×9): qty 1

## 2014-01-20 MED ORDER — LISINOPRIL 20 MG PO TABS
20.0000 mg | ORAL_TABLET | Freq: Every day | ORAL | Status: DC
  Administered 2014-01-21 – 2014-01-26 (×6): 20 mg via ORAL
  Filled 2014-01-20 (×7): qty 1
  Filled 2014-01-20: qty 14
  Filled 2014-01-20: qty 1

## 2014-01-20 MED ORDER — ACETAMINOPHEN 325 MG PO TABS
650.0000 mg | ORAL_TABLET | Freq: Four times a day (QID) | ORAL | Status: DC | PRN
Start: 2014-01-20 — End: 2014-01-27

## 2014-01-20 MED ORDER — THIAMINE HCL 100 MG/ML IJ SOLN
100.0000 mg | Freq: Once | INTRAMUSCULAR | Status: DC
Start: 2014-01-20 — End: 2014-01-27

## 2014-01-20 MED ORDER — LOPERAMIDE HCL 2 MG PO CAPS: 2.0000 mg | ORAL_CAPSULE | ORAL | Status: AC | PRN

## 2014-01-20 MED ORDER — HYDROCHLOROTHIAZIDE 25 MG PO TABS
25.0000 mg | ORAL_TABLET | Freq: Every day | ORAL | Status: DC
  Administered 2014-01-21 – 2014-01-26 (×6): 25 mg via ORAL
  Filled 2014-01-20 (×9): qty 1

## 2014-01-20 MED ORDER — ONDANSETRON 4 MG PO TBDP: 4.0000 mg | ORAL_TABLET | Freq: Four times a day (QID) | ORAL | Status: AC | PRN

## 2014-01-20 MED ORDER — ALUM & MAG HYDROXIDE-SIMETH 200-200-20 MG/5ML PO SUSP
30.0000 mL | ORAL | Status: DC | PRN
Start: 2014-01-20 — End: 2014-01-27

## 2014-01-20 MED ORDER — TRAZODONE HCL 50 MG PO TABS
50.0000 mg | ORAL_TABLET | Freq: Every evening | ORAL | Status: DC | PRN
  Administered 2014-01-20 – 2014-01-25 (×5): 50 mg via ORAL
  Filled 2014-01-20 (×4): qty 1
  Filled 2014-01-20: qty 14

## 2014-01-20 MED ORDER — LORAZEPAM 1 MG PO TABS: 1.0000 mg | ORAL_TABLET | Freq: Four times a day (QID) | ORAL | Status: AC | PRN

## 2014-01-20 MED ORDER — ADULT MULTIVITAMIN W/MINERALS CH
1.0000 | ORAL_TABLET | Freq: Every day | ORAL | Status: DC
  Administered 2014-01-20 – 2014-01-26 (×7): 1 via ORAL
  Filled 2014-01-20 (×4): qty 1
  Filled 2014-01-20: qty 14
  Filled 2014-01-20 (×6): qty 1

## 2014-01-20 MED ORDER — NICOTINE 21 MG/24HR TD PT24
MEDICATED_PATCH | TRANSDERMAL | Status: AC
  Filled 2014-01-20: qty 1

## 2014-01-20 MED ORDER — LISINOPRIL-HYDROCHLOROTHIAZIDE 20-25 MG PO TABS: 1.0000 | ORAL_TABLET | Freq: Every day | ORAL | Status: DC

## 2014-01-20 NOTE — Progress Notes (Signed)
Recreation Therapy Notes  Animal-Assisted Activity/Therapy (AAA/T) Program Checklist/Progress Notes Patient Eligibility Criteria Checklist & Daily Group note for Rec Tx Intervention  Date: 10.27.2015 Time: 2:45am Location: [address]    AAA/T Program Assumption of Risk Form signed by Patient/ or Parent Legal Guardian yes  Patient is free of allergies or sever asthma yes  Patient reports no fear of animals yes  Patient reports no history of cruelty to animals yes   Patient understands his/her participation is voluntary yes  Patient washes hands before animal contact yes  Patient washes hands after animal contact yes  Behavioral Response: Appropriate   Education: Hand Washing, Appropriate Animal Interaction   Education Outcome: Acknowledges education.   Clinical Observations/Feedback: Patient pet therapy dog appropriately.    Laureen Ochs Lacretia Tindall, LRT/CTRS  Lane Hacker 01/20/2014 4:29 PM

## 2014-01-20 NOTE — Progress Notes (Signed)
Pt accepted to [facility] 306-1. Patient support paperwork completed. CSW informed Rn who will call report to [facility] when patient ready to transfer to [facility].   Noreene Larsson [phone]  ED CSW 01/20/2014 1006am

## 2014-01-20 NOTE — [facility] Group Notes (Signed)
[place] LCSW Group Therapy      Feelings About Diagnosis 1:15 - 2:30 PM         01/20/2014    Type of Therapy:  Group Therapy  Participation Level:  Active  Participation Quality:  Appropriate  Affect:  Flat, Tremulous  Cognitive:  Appropriate  Insight:  Developing/Improving  Engagement in Therapy:  Developing/Improving  Modes of Intervention:  Discussion, Education, Exploration, Problem-Solving, Rapport Building, Support  Summary of Progress/Problems:  Patient shared he has been drinking a lot of beer.  He stated his son, who is in treatment, was disappointment to learn he was drinking again. Patient shared his sister is trying to get him into a long term program.  Concha Pyo 01/20/2014

## 2014-01-20 NOTE — ED Notes (Signed)
MD at bedside. 

## 2014-01-20 NOTE — Tx Team (Signed)
Initial Interdisciplinary Treatment Plan   PATIENT STRESSORS: Health problems Marital or family conflict Substance abuse   PROBLEM LIST: Problem List/Patient Goals Date to be addressed Date deferred Reason deferred Estimated date of resolution  Substance abuse 01/20/14                                                      DISCHARGE CRITERIA:  Ability to meet basic life and health needs Improved stabilization in mood, thinking, and/or behavior Motivation to continue treatment in a less acute level of care  PRELIMINARY DISCHARGE PLAN: Attend PHP/IOP Attend 12-step recovery group Return to previous living arrangement  PATIENT/FAMIILY INVOLVEMENT: This treatment plan has been presented to and reviewed with the patient, Greg Lewis.  The patient and family have been given the opportunity to ask questions and make suggestions.  Eduard Roux E 01/20/2014, 1:55 PM

## 2014-01-20 NOTE — Progress Notes (Signed)
D: Patient admitted to [facility] from [facility]. Patient presents with anxious affect and mood. He reported that he's been drinking a 12 pack of beer daily and has no remorse about it. Patient voiced that his only stressor is his wife, but now she's getting help.   A: Support and encouragement provided to patient. Oriented patient to the unit and informed him of the hospital's rules/policies. Initiated Q15 minute checks for safety.  R: Patient receptive. Denies SI/HI and AVH. Patient remains safe on the unit.

## 2014-01-20 NOTE — ED Notes (Signed)
Pt is being tx to [facility]
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
## 2014-01-20 NOTE — ED Notes (Signed)
Denies SI/HI

## 2014-01-20 NOTE — ED Notes (Signed)
Gave report to Canden rn at [facility]

## 2014-01-20 NOTE — ED Notes (Signed)
[facility] contacted for tx; awaiting their arrival

## 2014-01-20 NOTE — BH Assessment (Signed)
Tele Assessment Note   Greg Lewis is a 63 y.o. male who presents to [facility] for alcohol detox. Pt denies SI/HI/AVH.   Pt states he drinks 1-12pk of beer, daily. His last drink was 12/26, he consumed 1-12pk of beer.  Pt denies legal issues.  Pt denies any current w/d sxs, however he stated that he was experiencing excessive thirst.  Pt says he's had 1 seizure due to his drinking which happened 5 yrs ago and none since that time and no blackouts.  Pt reports no stressors and states is had more money he would drink more but he can only afford 1-12pk.  Per Patriciaann Clan, PA, pt accepted once medically cleared--BAL<200 and BP is therapeutic; addt'l labs have been requested.    Axis I: Alcohol Use D/O, Severe Axis II: Deferred Axis III:  Past Medical History  Diagnosis Date  . Hypertension   . ETOH abuse   . Cancer     colon   Axis IV: other psychosocial or environmental problems, problems related to social environment and problems with primary support group Axis V: 51-60 moderate symptoms  Past Medical History:  Past Medical History  Diagnosis Date  . Hypertension   . ETOH abuse   . Cancer     colon    Past Surgical History  Procedure Laterality Date  . Colonoscopy w/ biopsies and polypectomy      Family History: No family history on file.  Social History:  reports that he has been smoking Cigarettes.  He has been smoking about 1.50 packs per day. He does not have any smokeless tobacco history on file. He reports that he drinks about 50.4 ounces of alcohol per week. He reports that he does not use illicit drugs.  Additional Social History:  Alcohol / Drug Use Pain Medications: See MAR  Prescriptions: See MAR  Over the Counter: See MAR  History of alcohol / drug use?: Yes Longest period of sobriety (when/how long): Only when in detox  Negative Consequences of Use: Work / School;Personal relationships;Financial Withdrawal Symptoms: Other (Comment) (Elevated blood pressure  ) Substance #1 Name of Substance 1: Alcohol  1 - Age of First Use: Teens  1 - Amount (size/oz): 12pk  1 - Frequency: Daily  1 - Duration: On-going  1 - Last Use / Amount: 01/19/14  CIWA: CIWA-Ar BP: 168/68 mmHg Pulse Rate: 81 Nausea and Vomiting: no nausea and no vomiting Tactile Disturbances: none Tremor: not visible, but can be felt fingertip to fingertip Auditory Disturbances: not present Paroxysmal Sweats: no sweat visible Visual Disturbances: not present Anxiety: no anxiety, at ease Headache, Fullness in Head: none present Agitation: normal activity Orientation and Clouding of Sensorium: oriented and can do serial additions CIWA-Ar Total: 1 COWS:    PATIENT STRENGTHS: (choose at least two) Supportive family/friends  Allergies:  Allergies  Allergen Reactions  . Cabbage Other (See Comments)    Said it was bout lethal    Home Medications:  (Not in a hospital admission)  OB/GYN Status:  No LMP for male patient.  General Assessment Data Location of Assessment: [facility] Is this a Tele or Face-to-Face Assessment?: Face-to-Face Is this an Initial Assessment or a Re-assessment for this encounter?: Initial Assessment Living Arrangements: Other (Comment);Spouse/significant other (Son, wife and grandchild in the home ) Can pt return to current living arrangement?: Yes Admission Status: Voluntary Is patient capable of signing voluntary admission?: Yes Transfer from: Home Referral Source: Self/Family/Friend  Medical Screening Exam ([place]) Medical Exam completed: No  Reason for MSE not completed: Other: (None )  [facility] Crisis Care Plan Living Arrangements: Other (Comment);Spouse/significant other (Son, wife and grandchild in the home ) Name of Psychiatrist: None  Name of Therapist: None   Education Status Is patient currently in school?: No Current Grade: None  Highest grade of school patient has completed: None  Name of school: None  Contact person: None    Risk to self with the past 6 months Suicidal Ideation: No Suicidal Intent: No Is patient at risk for suicide?: No Suicidal Plan?: No Access to Means: No What has been your use of drugs/alcohol within the last 12 months?: Abusing: alcohol  Previous Attempts/Gestures: No How many times?: 0 Other Self Harm Risks: None  Triggers for Past Attempts: None known Intentional Self Injurious Behavior: None Family Suicide History: No Recent stressful life event(s): Other (Comment) (Chronic SA ) Persecutory voices/beliefs?: No Depression: Yes Depression Symptoms: Loss of interest in usual pleasures Substance abuse history and/or treatment for substance abuse?: Yes Suicide prevention information given to non-admitted patients: Not applicable  Risk to Others within the past 6 months Homicidal Ideation: No Thoughts of Harm to Others: No Current Homicidal Intent: No Current Homicidal Plan: No Access to Homicidal Means: No Identified Victim: None  History of harm to others?: No Assessment of Violence: None Noted Violent Behavior Description: None  Does patient have access to weapons?: No Criminal Charges Pending?: No Does patient have a court date: No  Psychosis Hallucinations: None noted Delusions: None noted  Mental Status Report Appear/Hygiene: Disheveled;In scrubs Eye Contact: Good Motor Activity: Unremarkable Speech: Logical/coherent;Slow Level of Consciousness: Alert Mood: Other (Comment) (Appropriate ) Affect: Appropriate to circumstance Anxiety Level: None Thought Processes: Coherent;Relevant Judgement: Unimpaired Orientation: Person;Place;Time;Situation Obsessive Compulsive Thoughts/Behaviors: Minimal  Cognitive Functioning Concentration: Normal Memory: Recent Intact;Remote Intact IQ: Average Insight: Good Impulse Control: Good Appetite: Fair Weight Loss: 0 Weight Gain: 0 Sleep: No Change Total Hours of Sleep: 5 Vegetative Symptoms: None  ADLScreening [facility]  Assessment Services) Patient's cognitive ability adequate to safely complete daily activities?: Yes Patient able to express need for assistance with ADLs?: Yes Independently performs ADLs?: Yes (appropriate for developmental age)  Prior Inpatient Therapy Prior Inpatient Therapy: Yes Prior Therapy Dates: 2013 Prior Therapy Facilty/Provider(s): [place], [facility]  Reason for Treatment: Detox/Rehab   Prior Outpatient Therapy Prior Outpatient Therapy: No Prior Therapy Dates: None  Prior Therapy Facilty/Provider(s): None  Reason for Treatment: None   ADL Screening (condition at time of admission) Patient's cognitive ability adequate to safely complete daily activities?: Yes Is the patient deaf or have difficulty hearing?: No Does the patient have difficulty seeing, even when wearing glasses/contacts?: No Does the patient have difficulty concentrating, remembering, or making decisions?: No Patient able to express need for assistance with ADLs?: Yes Does the patient have difficulty dressing or bathing?: No Independently performs ADLs?: Yes (appropriate for developmental age) Does the patient have difficulty walking or climbing stairs?: No Weakness of Legs: None Weakness of Arms/Hands: None  Home Assistive Devices/Equipment Home Assistive Devices/Equipment: None  Therapy Consults (therapy consults require a physician order) PT Evaluation Needed: No OT Evalulation Needed: No SLP Evaluation Needed: No Abuse/Neglect Assessment (Assessment to be complete while patient is alone) Physical Abuse: Denies Verbal Abuse: Denies Sexual Abuse: Denies Exploitation of patient/patient's resources: Denies Self-Neglect: Denies Values / Beliefs Cultural Requests During Hospitalization: None Spiritual Requests During Hospitalization: None Consults Spiritual Care Consult Needed: No Social Work Consult Needed: No Regulatory affairs officer (For Healthcare) Does patient have an advance directive?: No Would  patient like information on creating an advanced directive?: No - patient declined information Nutrition Screen- [facility] Patient's home diet: Regular  Additional Information 1:1 In Past 12 Months?: No CIRT Risk: No Elopement Risk: No Does patient have medical clearance?: Yes     Disposition:  Disposition Initial Assessment Completed for this Encounter: Yes Disposition of Patient: Inpatient treatment program;Referred to (Once med cleared, Patriciaann Clan, PA accepted; 306-1) Type of inpatient treatment program: Adult Patient referred to: Other (Comment) (Once med cleared, Patriciaann Clan, PA accepted; 306-1)  Girtha Rm 01/20/2014 5:18 AM

## 2014-01-20 NOTE — [facility]HH Suicide Risk Assessment (Signed)
[facility] INPATIENT:  Family/Significant Other Suicide Prevention Education  Suicide Prevention Education:  Education Completed;  has been identified by the patient as the family member/significant other with whom the patient will be residing, and identifiedhe person(s) who will aid the patient in the event of a mental health crisis (suicidal ideations/suicide attempt).  With written consent from the patient, the family member/significant other has been provided the following suicide prevention education, prior to the and/or following the discharge of the patient.  NO SPE needed as patient not endorsing Suicidal ideation on admission or during hospitalization.  The suicide prevention education provided includes the following:  Suicide risk factors  Suicide prevention and interventions  National Suicide Hotline telephone number  [facility] assessment telephone number  [facility] Emergency Assistance [place] and/or Residential Mobile Crisis Unit telephone number  Request made of family/significant other to:  Remove weapons (e.g., guns, rifles, knives), all items previously/currently identified as safety concern.    Remove drugs/medications (over-the-counter, prescriptions, illicit drugs), all items previously/currently identified as a safety concern.  The family member/significant other verbalizes understanding of the suicide prevention education information provided.  The family member/significant other agrees to remove the items of safety concern listed above.  Concha Pyo 01/20/2014, 3:22 PM

## 2014-01-21 DIAGNOSIS — F1024 Alcohol dependence with alcohol-induced mood disorder: Secondary | ICD-10-CM

## 2014-01-21 DIAGNOSIS — F10239 Alcohol dependence with withdrawal, unspecified: Principal | ICD-10-CM

## 2014-01-21 NOTE — H&P (Signed)
Psychiatric Admission Assessment Adult  Patient Identification:  Greg Lewis Date of Evaluation:  01/21/2014 Chief Complaint:  " I picked up beer again" History of Present Illness::  63 year old man, who presented to the hospital requesting detoxification from alcohol. After several months of sobriety, he relapsed about a month ago, and has been drinking approximately 12 beers, and sometimes some liquor in addition, daily. In the context of his relapse, he has lost some weight, has been eating poorly and has been feeling somewhat depressed, emotionally labile. At this time patient presents with slight distal tremors, tachycardia, but denies diaphoresis, and does not appear to be in any acute distress or discomfort. He states " I think I am over the hump of the withdrawal because I felt worse yesterday" He cannot identify any specific stressor that might have triggered his relapse,  But does state that he has family stressors, particularly his wife's chronic depression and his adult son's hisotry of substance abuse. At this time he minimizes depression and describes his mood as " pretty good"  Elements: Chronic alcohol dependence, recently relapsed, has been drinking daily and heavily since then. Associated Signs/Synptoms: Depression Symptoms: Currently minimizes or denies any significant neuro-vegetative symptoms, denies sadness or anhedonia (Hypo) Manic Symptoms:  Denies  Anxiety Symptoms:  Describes some anxiety which he attributes to family stressors- denies panic attacks Psychotic Symptoms:  Denies, and does not appear psychotic or internally preoccupied PTSD Symptoms: Denies  Total Time spent with patient: 45 minutes  Psychiatric Specialty Exam: Physical Exam  Review of Systems  Constitutional: Positive for weight loss. Negative for fever and chills.  HENT: Positive for hearing loss.        Headaches only when he has sinusitis  Eyes: Negative for blurred vision and double vision.   Respiratory: Negative for cough and shortness of breath.   Cardiovascular: Negative for chest pain.  Gastrointestinal: Positive for nausea. Negative for melena.  Genitourinary: Negative for hematuria.  Skin: Negative for rash.  Neurological: Positive for seizures and headaches.       History of a withdrawal seizure months ago  Psychiatric/Behavioral: Positive for substance abuse. The patient is nervous/anxious.     Blood pressure 129/83, pulse 118, temperature 98.2 F (36.8 C), temperature source Oral, resp. rate 17, height 5' 7.75" (1.721 m), weight 62.483 kg (137 lb 12 oz).Body mass index is 21.1 kg/(m^2).  General Appearance: Fairly Groomed  Engineer, water::  Good  Speech:  Normal Rate- communication at times made difficult by patient's hypoacusia- but does understand if spoken at a louder voice   Volume:  Normal  Mood:  slightly depressed mood, but denies feeling depressed, affect constricted but fully reactive  Affect:  reactive  Thought Process:  Linear  Orientation:  Full (Time, Place, and Person)  Thought Content:  denies hallucinations, and no delusions expressed  Suicidal Thoughts:  No  Homicidal Thoughts:  No  Memory:  recent and remote grossly intact   Judgement:  Fair  Insight:  Fair  Psychomotor Activity:  Normal  Concentration:  Good  Recall:  NA  Fund of Knowledge:Good  Language: Good  Akathisia:  Negative  Handed:  Right  AIMS (if indicated):     Assets:  Desire for Improvement Resilience Social Support  Sleep:       Musculoskeletal: Strength & Muscle Tone: within normal limits- mild /subtle distal tremors at this time, no diaphoresis, does not appear to be in any acute distress Gait & Station: normal Patient leans: N/A  Past  Psychiatric History: Diagnosis: Describes long history of alcohol dependence  Hospitalizations: prior detoxifications  Outpatient Care: at this time does not endorse   Substance Abuse Care: at this time does not endorse   Self-Mutilation: Denies  Suicidal Attempts: Denies   Violent Behaviors: Denies    Past Medical History:  Patient states he has had cancer in the past, but describes it as bladder, not colon cancer, as below. States he had received some treatment in the past, but not recently. Past Medical History  Diagnosis Date  . Hypertension   . ETOH abuse   . Cancer     colon   Loss of Consciousness:  Denies Seizure History:  (+) history of alcohol related seizure several months ago- one episode Cardiac History:  Denies Allergies:   Allergies  Allergen Reactions  . Cabbage Other (See Comments)    Said it was bout lethal   PTA Medications: Prescriptions prior to admission  Medication Sig Dispense Refill  . lisinopril-hydrochlorothiazide (PRINZIDE,ZESTORETIC) 20-25 MG per tablet Take 1 tablet by mouth daily.      . Multiple Vitamin (MULTIVITAMIN WITH MINERALS) TABS tablet Take 1 tablet by mouth daily.        Previous Psychotropic Medications:  Medication/Dose  Does not endorse having been on any psychiatric medications               Substance Abuse History in the last 12 months:  Yes.  - alcohol dependence- drinking heavily and daily x one month, denies drug abuse   Consequences of Substance Abuse: states he has had " shakes bad" when he has detoxed in the past. (+) history of seizure x 1 as above, denies any history of DTs  Social History:  reports that he has been smoking Cigarettes.  He has been smoking about 1.00 pack per day. He does not have any smokeless tobacco history on file. He reports that he drinks about 50.4 ounces of alcohol per week. He reports that he does not use illicit drugs. 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 Legal History: denies  Hobbies/Interests:  Family History:  Parents are deceased- he states his father and his brother were alcoholic. Does not endorse mental illness in family or any history of suicides in family.  Results for orders placed during the hospital encounter of 01/19/14 (from the past 72 hour(s))  URINE RAPID DRUG SCREEN (HOSP PERFORMED)     Status: None   Collection Time    01/19/14 10:21 PM      Result Value Ref Range   Opiates NONE DETECTED  NONE DETECTED   Cocaine NONE DETECTED  NONE DETECTED   Benzodiazepines NONE DETECTED  NONE DETECTED   Amphetamines NONE DETECTED  NONE DETECTED   Tetrahydrocannabinol NONE DETECTED  NONE DETECTED   Barbiturates NONE DETECTED  NONE DETECTED   Comment:            DRUG SCREEN FOR MEDICAL PURPOSES     ONLY.  IF CONFIRMATION IS NEEDED     FOR ANY PURPOSE, NOTIFY LAB  WITHIN 5 DAYS.                LOWEST DETECTABLE LIMITS     FOR URINE DRUG SCREEN     Drug Class       Cutoff (ng/mL)     Amphetamine      1000     Barbiturate      200     Benzodiazepine   102     Tricyclics       585     Opiates          300     Cocaine          300     THC              50  ACETAMINOPHEN LEVEL     Status: None   Collection Time    01/19/14 10:25 PM      Result Value Ref Range   Acetaminophen (Tylenol), Serum <15.0  10 - 30 ug/mL   Comment:            THERAPEUTIC CONCENTRATIONS VARY     SIGNIFICANTLY. A RANGE OF 10-30     ug/mL MAY BE AN EFFECTIVE     CONCENTRATION FOR MANY PATIENTS.     HOWEVER, SOME ARE BEST TREATED     AT CONCENTRATIONS OUTSIDE THIS     RANGE.     ACETAMINOPHEN CONCENTRATIONS     >150 ug/mL AT 4 HOURS AFTER     INGESTION AND >50 ug/mL AT  12     HOURS AFTER INGESTION ARE     OFTEN ASSOCIATED WITH TOXIC     REACTIONS.  CBC     Status: Abnormal   Collection Time    01/19/14 10:25 PM      Result Value Ref Range   WBC 5.2  4.0 - 10.5 K/uL   RBC 5.20  4.22 - 5.81 MIL/uL   Hemoglobin 17.7 (*) 13.0 - 17.0 g/dL   HCT 50.2  39.0 - 52.0 %   MCV 96.5  78.0 - 100.0 fL   MCH 34.0  26.0 - 34.0 pg   MCHC 35.3  30.0 - 36.0 g/dL   RDW 14.1  11.5 - 15.5 %   Platelets 90 (*) 150 - 400 K/uL   Comment: REPEATED TO VERIFY     SPECIMEN CHECKED FOR CLOTS     PLATELET COUNT CONFIRMED BY SMEAR     LARGE PLATELETS PRESENT  COMPREHENSIVE METABOLIC PANEL     Status: Abnormal   Collection Time    01/19/14 10:25 PM      Result Value Ref Range   Sodium 141  137 - 147 mEq/L   Potassium 4.2  3.7 - 5.3 mEq/L   Chloride 99  96 - 112 mEq/L   CO2 27  19 - 32 mEq/L   Glucose, Bld 77  70 - 99 mg/dL   BUN 7  6 - 23 mg/dL   Creatinine, Ser 0.66  0.50 - 1.35 mg/dL   Calcium 8.6  8.4 - 10.5 mg/dL   Total Protein 7.7  6.0 - 8.3 g/dL   Albumin 4.0  3.5 - 5.2 g/dL   AST 101 (*) 0 - 37 U/L   ALT 44  0 - 53 U/L   Alkaline Phosphatase 117  39 - 117 U/L   Total Bilirubin 0.6  0.3 - 1.2 mg/dL   GFR calc non Af Amer >90  >90 mL/min   GFR calc Af  Amer >90  >90 mL/min   Comment: (NOTE)     The eGFR has been calculated using the CKD EPI equation.     This calculation has not been validated in all clinical situations.     eGFR's persistently <90 mL/min signify possible Chronic Kidney     Disease.   Anion gap 15  5 - 15  ETHANOL     Status: Abnormal   Collection Time    01/19/14 10:25 PM      Result Value Ref Range   Alcohol, Ethyl (B) 312 (*) 0 - 11 mg/dL   Comment:            LOWEST DETECTABLE LIMIT FOR     SERUM ALCOHOL IS 11 mg/dL     FOR MEDICAL PURPOSES ONLY  SALICYLATE LEVEL     Status: Abnormal   Collection Time    01/19/14 10:25 PM      Result Value Ref Range   Salicylate Lvl <0.0 (*) 2.8 - 20.0 mg/dL  ETHANOL     Status: Abnormal    Collection Time    01/20/14  3:51 AM      Result Value Ref Range   Alcohol, Ethyl (B) 213 (*) 0 - 11 mg/dL   Comment:            LOWEST DETECTABLE LIMIT FOR     SERUM ALCOHOL IS 11 mg/dL     FOR MEDICAL PURPOSES ONLY  ETHANOL     Status: Abnormal   Collection Time    01/20/14 11:20 AM      Result Value Ref Range   Alcohol, Ethyl (B) 83 (*) 0 - 11 mg/dL   Comment:            LOWEST DETECTABLE LIMIT FOR     SERUM ALCOHOL IS 11 mg/dL     FOR MEDICAL PURPOSES ONLY   Psychological Evaluations:  Assessment:   63 year old man , who has a history of  Alcohol dependence. After several months of sobriety, relapsed about one month ago and has been drinking daily and heavily since then. In the context of drinking has lost weight and PO intake has decreased. His admit BAL was 312.  He presented with family member, requesting detoxification. He has had one prior seizure and a history of alcohol related withdrawal symptoms, no history of DTs. Has a history of cancer ( colon?, bladder?) and low platelet count ( likely related to alcohol) At this time slight tremors and palpitations, but in no acute distress or discomfort.  Of note, currently minimizes depression or anxiety, and denies any SI.   AXIS I: Alcohol Dependence, alcohol withdrawal, Alcohol induced mood disorder depressed, improving AXIS II:  Deferred AXIS III:   Past Medical History  Diagnosis Date  . Hypertension   . ETOH abuse   . Cancer     colon   AXIS IV:  educational problems and problems related to social environment AXIS V:  41-50 serious symptoms  Treatment Plan/Recommendations:  See below  Treatment Plan Summary: Daily contact with patient to assess and evaluate symptoms and progress in treatment Medication management See below Current Medications:  Current Facility-Administered Medications  Medication Dose Route Frequency Provider Last Rate Last Dose  . acetaminophen (TYLENOL) tablet 650 mg  650 mg Oral Q6H PRN  Elmarie Shiley, NP      . alum & mag hydroxide-simeth (MAALOX/MYLANTA) 200-200-20 MG/5ML suspension 30 mL  30 mL Oral Q4H PRN Elmarie Shiley, NP      .  lisinopril (PRINIVIL,ZESTRIL) tablet 20 mg  20 mg Oral Daily Nicholaus Bloom, MD   20 mg at 01/21/14 0756   And  . hydrochlorothiazide (HYDRODIURIL) tablet 25 mg  25 mg Oral Daily Nicholaus Bloom, MD   25 mg at 01/21/14 0756  . hydrOXYzine (ATARAX/VISTARIL) tablet 25 mg  25 mg Oral Q6H PRN Elmarie Shiley, NP      . loperamide (IMODIUM) capsule 2-4 mg  2-4 mg Oral PRN Elmarie Shiley, NP      . LORazepam (ATIVAN) tablet 1 mg  1 mg Oral Q6H PRN Elmarie Shiley, NP      . LORazepam (ATIVAN) tablet 1 mg  1 mg Oral QID Elmarie Shiley, NP   1 mg at 01/21/14 1158   Followed by  . [START ON 01/22/2014] LORazepam (ATIVAN) tablet 1 mg  1 mg Oral TID Elmarie Shiley, NP       Followed by  . [START ON 01/23/2014] LORazepam (ATIVAN) tablet 1 mg  1 mg Oral BID Elmarie Shiley, NP       Followed by  . [START ON 01/24/2014] LORazepam (ATIVAN) tablet 1 mg  1 mg Oral Daily Elmarie Shiley, NP      . magnesium hydroxide (MILK OF MAGNESIA) suspension 30 mL  30 mL Oral Daily PRN Elmarie Shiley, NP      . multivitamin with minerals tablet 1 tablet  1 tablet Oral Daily Elmarie Shiley, NP   1 tablet at 01/21/14 0756  . ondansetron (ZOFRAN-ODT) disintegrating tablet 4 mg  4 mg Oral Q6H PRN Elmarie Shiley, NP      . thiamine (B-1) injection 100 mg  100 mg Intramuscular Once Elmarie Shiley, NP      . thiamine (VITAMIN B-1) tablet 100 mg  100 mg Oral Daily Elmarie Shiley, NP   100 mg at 01/21/14 0756  . traZODone (DESYREL) tablet 50 mg  50 mg Oral QHS PRN Elmarie Shiley, NP   50 mg at 01/20/14 2148    Observation Level/Precautions:  15 minute checks  Laboratory:  as needed  Psychotherapy:  Group, support, milieu  Medications:  On Ativan taper as per detox protocol.  Consultations:  If needed   Discharge Concerns:  Patient states his family wants him to go to a [place] based residential setting after discharge but  admits he is currently ambivalent about this  Estimated LOS: 5 days  Other:     I certify that inpatient services furnished can reasonably be expected to improve the patient's condition.   Constancia Geeting 10/28/201512:55 PM

## 2014-01-21 NOTE — B[facility] Group Notes (Signed)
[name] LCSW Group Therapy  Emotional Regulation 1:15 - 2:30 PM  01/21/2014 2:05 PM  Type of Therapy:  Group Therapy  Participation Level:  Minimal  Participation Quality:  Attentive  Affect:  Appropriate  Cognitive:  Appropriate and Lacking  Insight:  Limited  Engagement in Therapy:  Limited, Developing/Improving  Modes of Intervention:  Education, Exploration, Problem-solving, Rapport Building and Support  Summary of Progress/Problems:  The topic of group was emotional regulation.  Patient attempted to participate but was limited in his understanding of the topic.  Concha Pyo 01/21/2014, 2:05 PM

## 2014-01-21 NOTE — Clinical Social Work Note (Signed)
Late Entry for 01/20/14:  CSW spoke with patient sister who patient advised is trying to find a long term treatment program for him.  Sister advised patient has been to [facility] several times but has not benefited from treatment. She requested CSW contact [facility] in [place], [place]. [employer] contacted and was advised they have a five to six weeks waiting list.  They also advised in order for patient to be placed on their waiting list he would have to call to complete their interview/application process and present results of HIV testing.  They provided CSW with information of [facility] and shared it is not a free program. CSW contacted sister to determine family's ability to assist with treatment if out-of-pocket compensation is needed.  Sister to follow up with patient's wife, who patient reports is in the hospital, and call CSW back.

## 2014-01-21 NOTE — [facility] Group Notes (Signed)
[facility] LCSW Aftercare Discharge Planning Group Note   01/21/2014 12:31 PM    Participation Quality:  Appropraite  Mood/Affect:  Appropriate  Depression Rating:  1  Anxiety Rating:  1  Thoughts of Suicide:  No  Will you contract for safety?   NA  Current AVH:  No  Plan for Discharge/Comments:  Patient attended discharge planning group and actively participated in group. He reports doing well and hopes to discharge home soon.  Patient advised he is not interested in residential treatment but agrees to follow up with outpatient services.  Suicide prevention education reviewed and SPE document provided.   Transportation Means: Patient has transportation.   Supports:  Patient has a support system.   Gracianna Vink, Eulas Post

## 2014-01-21 NOTE — ED Provider Notes (Signed)
Medical screening examination/treatment/procedure(s) were performed by non-physician practitioner and as supervising physician I was immediately available for consultation/collaboration.   EKG Interpretation None       Virgel Manifold, MD 01/21/14 1118

## 2014-01-21 NOTE — Progress Notes (Signed)
D: Patient has anxious affect and mood. Declined self inventory sheet today. Writer observed that the patient has been walking up and down the hallway and interacting with peers in the dayroom. He's compliant with all his medications and tolerating them well.  A: Support and encouragement provided to patient. Scheduled medications administered per MD orders. Maintain Q15 minute checks for safety.  R: Patient receptive. Denies SI/HI and AVH. Patient remains safe.

## 2014-01-21 NOTE — Clinical Social Work Note (Signed)
CSW and patient spoke with his sister to advise her patient is requesting to discharge home and follow up with [facility].  Sister was in agreement with patient's decision.

## 2014-01-21 NOTE — Progress Notes (Signed)
D. Pt has been up and visible in milieu this evening, has been attending and participating in various activities. Pt appears anxious and tremulous and has been up and down this evening and has had a difficult time in getting sufficient rest. A. Support and encouragement provided. R. Safety maintained, will continue to monitor.

## 2014-01-21 NOTE — [facility] Suicide Risk Assessment (Signed)
   Nursing information obtained from:    Demographic factors:   63 year old married man Current Mental Status:   See below Loss Factors:   family stressors Historical Factors:   Alcohol dependence Risk Reduction Factors:   sense of responsibility to family Total Time spent with patient: 45 minutes  CLINICAL FACTORS:  Alcohol dependence   Psychiatric Specialty Exam: Physical Exam  ROS  Blood pressure 129/83, pulse 118, temperature 98.2 F (36.8 C), temperature source Oral, resp. rate 17, height 5' 7.75" (1.721 m), weight 62.483 kg (137 lb 12 oz).Body mass index is 21.1 kg/(m^2).  SEE ADMIT NOTE MSE   COGNITIVE FEATURES THAT CONTRIBUTE TO RISK:  Closed-mindedness    SUICIDE RISK:   Moderate:  Frequent suicidal ideation with limited intensity, and duration, some specificity in terms of plans, no associated intent, good self-control, limited dysphoria/symptomatology, some risk factors present, and identifiable protective factors, including available and accessible social support.  PLAN OF CARE: Admit patient to inpatient unit for the purpose of detoxification/ management of withdrawal. Provide support and encouragement regarding sobriety/abstinence efforts. Will follow daily.   I certify that inpatient services furnished can reasonably be expected to improve the patient's condition.  Jeremie Abdelaziz, [place] 01/21/2014, 2:22 PM

## 2014-01-22 MED ORDER — LABETALOL HCL 100 MG PO TABS
100.0000 mg | ORAL_TABLET | ORAL | Status: AC
  Administered 2014-01-22: 100 mg via ORAL
  Filled 2014-01-22 (×2): qty 1

## 2014-01-22 NOTE — Clinical Social Work Note (Signed)
CSW spoke with Greg Lewis at [facility] in an effort to refer patient for outpatient follow up.  She advised the only services they offer is medication management - no counseling or SA services available.  Ms. Clifton James to send a list of community providers who may offer groups.

## 2014-01-22 NOTE — Plan of Care (Signed)
Problem: Diagnosis: Increased Risk For Suicide Attempt Goal: LTG-Patient Will Show Positive Response to Medication LTG (by discharge) : Patient will show positive response to medication and will participate in the development of the discharge plan. Outcome: Completed/Met Date Met:  01/22/14 Patient denied SI and HI.  Contracts for safety.  Patient will discuss with staff any SI/HI feelings/thoughts.

## 2014-01-22 NOTE — BHH Group Notes (Signed)
[place] LCSW Group Therapy  [facility] LCSW Group Therapy  [employer] [facility] [place] 1:15 - 2:30 PM  01/22/2014   Type of Therapy:  Group Therapy  Participation Level: Minimal  Participation Quality:  Attentive  Affect:  Appropriate  Cognitive:  Appropriate  Insight:  Developing/Improving   Engagement in Therapy:  Developing/Improving   Modes of Intervention:  Discussion, Education, Exploration, Problem-Solving, Rapport Building, Support   Summary of Progress/Problems:   Patient was attentive to speaker from the [employer] but made no comments on the presentation. Concha Pyo 01/22/2014

## 2014-01-22 NOTE — [facility] Group Notes (Signed)
[place] Group Notes:  (Nursing/MHT/Case Management/Adjunct)  Date:  01/22/2014  Time:  0900 am  Type of Therapy:  Psychoeducational Skills  Participation Level:  Did Not Attend    Greg Lewis 01/22/2014, 1:10 PM

## 2014-01-22 NOTE — Progress Notes (Signed)
D:  Patient's self inventory sheet, patient sleeps good.  Good appetite, low energy level, poor concentration.  Denied depression, rated hopeless and anxiety #5.  Continues to experience tremors and cravings.  Denied SI.  Denied physical problems.  Goal is to go home.  Will discuss discharge with MD.  Plans to return home.  No problems following up after discharge. A:  Medications administered per MD orders.  Emotional support and encouragement given patient. R:  Denied SI and HI, contracts for safety.  Denied A/V hallucinations.  Safety maintained with 15 minute checks.

## 2014-01-22 NOTE — Progress Notes (Signed)
[facility] MD Progress Note  01/22/2014 1:30 PM Greg Lewis  MRN:  [id number] Subjective:   Patient states that he is feeling better. At this time does not endorse severe withdrawal symptoms. His major concerns pertain to disposition plans and to his wife's whereabouts. Objective: Patient is currently improved. His grooming is improved, he appears more animated, and he states he is eating better and " starting to get some of my weight back". He has subtle distal tremors, but appears calm, in no acute distress, and is not diaphoretic or blushed. He does remain tachycardic. He has had no seizures. He is tolerating Ativan taper well without side effects or excessive sedation. At his request and with his express consent I spoke with his sister Manuela Schwartz on the phone ( telephone [phone]) She states she is coming to visit tonight- she confirms that patient's wife is in a psychiatric facility but does not know the name of it. She states she feels strongly patient should try to go to an inpatient rehab after discharge, as he " will likely relapse if he does not based on how it's been in the past". Patient admits he is ambivalent about this and would prefer outpatient treatment. She states she will try to encourage him to agree when she comes to visit. No disruptive behaviors on unit. Diagnosis:  Alcohol Dependence, Alcohol Withdrawal  Total Time spent with patient: 25 minutes    ADL's: improved   Sleep: improved   Appetite:improved  Suicidal Ideation:  Denies any suicidal ideations at present  Homicidal Ideation:  Denies any homicidal ideations AEB (as evidenced by):  Psychiatric Specialty Exam: Physical Exam  Review of Systems  Constitutional: Negative for fever and chills.  Respiratory: Negative for cough and shortness of breath.   Cardiovascular: Negative for chest pain.  Gastrointestinal: Negative for nausea and vomiting.  Genitourinary: Negative for dysuria, urgency and frequency.   Skin: Negative for rash.  Neurological: Positive for tremors. Negative for seizures.  Psychiatric/Behavioral: Positive for suicidal ideas and substance abuse. The patient is nervous/anxious.     Blood pressure 125/91, pulse 116, temperature 97.3 F (36.3 C), temperature source Oral, resp. rate 18, height 5' 7.75" (1.721 m), weight 62.483 kg (137 lb 12 oz).Body mass index is 21.1 kg/(m^2).  General Appearance: improved grooming  Eye Contact::  Good  Speech:  Normal Rate  Volume:  Normal  Mood:  denies depression- mildly anxious  Affect:  Appropriate  Thought Process:  Goal Directed and Linear  Orientation:  Full (Time, Place, and Person)  Thought Content:  denies hallucinations, no visual disturbances  Suicidal Thoughts:  No denies hallucinations, no delusions  Homicidal Thoughts:  No  Memory:  recent and remote grossly intact   Judgement:  Fair  Insight:  Fair  Psychomotor Activity:  Normal  Concentration:  Good  Recall:  Good  Fund of Knowledge:Good  Language: Good  Akathisia:  Negative  Handed:  Right  AIMS (if indicated):     Assets:  Desire for Improvement Resilience  Sleep:  Number of Hours: 6.5   Musculoskeletal: Strength & Muscle Tone: within normal limits Gait & Station: normal Patient leans: N/A  Current Medications: Current Facility-Administered Medications  Medication Dose Route Frequency Provider Last Rate Last Dose  . acetaminophen (TYLENOL) tablet 650 mg  650 mg Oral Q6H PRN Elmarie Shiley, NP      . alum & mag hydroxide-simeth (MAALOX/MYLANTA) 200-200-20 MG/5ML suspension 30 mL  30 mL Oral Q4H PRN Elmarie Shiley, NP      .  lisinopril (PRINIVIL,ZESTRIL) tablet 20 mg  20 mg Oral Daily Nicholaus Bloom, MD   20 mg at 01/22/14 9371   And  . hydrochlorothiazide (HYDRODIURIL) tablet 25 mg  25 mg Oral Daily Nicholaus Bloom, MD   25 mg at 01/22/14 [phone]  . hydrOXYzine (ATARAX/VISTARIL) tablet 25 mg  25 mg Oral Q6H PRN Elmarie Shiley, NP      . loperamide (IMODIUM) capsule 2-4  mg  2-4 mg Oral PRN Elmarie Shiley, NP      . LORazepam (ATIVAN) tablet 1 mg  1 mg Oral Q6H PRN Elmarie Shiley, NP      . LORazepam (ATIVAN) tablet 1 mg  1 mg Oral TID Elmarie Shiley, NP   1 mg at 01/22/14 1136   Followed by  . [START ON 01/23/2014] LORazepam (ATIVAN) tablet 1 mg  1 mg Oral BID Elmarie Shiley, NP       Followed by  . [START ON 01/24/2014] LORazepam (ATIVAN) tablet 1 mg  1 mg Oral Daily Elmarie Shiley, NP      . magnesium hydroxide (MILK OF MAGNESIA) suspension 30 mL  30 mL Oral Daily PRN Elmarie Shiley, NP      . multivitamin with minerals tablet 1 tablet  1 tablet Oral Daily Elmarie Shiley, NP   1 tablet at 01/22/14 [phone]  . ondansetron (ZOFRAN-ODT) disintegrating tablet 4 mg  4 mg Oral Q6H PRN Elmarie Shiley, NP      . thiamine (B-1) injection 100 mg  100 mg Intramuscular Once Elmarie Shiley, NP      . thiamine (VITAMIN B-1) tablet 100 mg  100 mg Oral Daily Elmarie Shiley, NP   100 mg at 01/22/14 1017  . traZODone (DESYREL) tablet 50 mg  50 mg Oral QHS PRN Elmarie Shiley, NP   50 mg at 01/20/14 2148    Lab Results: No results found for this or any previous visit (from the past 48 hour(s)).  Physical Findings: AIMS: Facial and Oral Movements Muscles of Facial Expression: None, normal Lips and Perioral Area: None, normal Jaw: None, normal Tongue: None, normal,Extremity Movements Upper (arms, wrists, hands, fingers): None, normal Lower (legs, knees, ankles, toes): None, normal, Trunk Movements Neck, shoulders, hips: None, normal, Overall Severity Severity of abnormal movements (highest score from questions above): None, normal Incapacitation due to abnormal movements: None, normal Patient's awareness of abnormal movements (rate only patient's report): No Awareness, Dental Status Current problems with teeth and/or dentures?: No Does patient usually wear dentures?: No  CIWA:  CIWA-Ar Total: 3 COWS:  COWS Total Score: 6  Assessment-  Patient is doing better- he is better groomed, with improving  symptoms of alcohol withdrawal, and today in no acute distress or discomfort. He is tolerating Ativan taper well thus far. As noted, family ( sister) want him to go to a Rehab after discharge, but patient currently reluctant to further inpatient stay after discharge. Not significantly depressed or anxious at this time.  Treatment Plan Summary: Daily contact with patient to assess and evaluate symptoms and progress in treatment Medication management See below  Plan: Continue inpatient treatment. Continue to provide milieu/support Continue Ativan taper as per alcohol detox protocol  Medical Decision Making Problem Points:  Established problem, stable/improving (1), Review of last therapy session (1) and Review of psycho-social stressors (1) Data Points:  Review of new medications or change in dosage (2)  I certify that inpatient services furnished can reasonably be expected to improve the patient's condition.   COBOS, FERNANDO 01/22/2014, 1:30 PM

## 2014-01-22 NOTE — Progress Notes (Signed)
D: Pt presents anxious in affect and mood. Pt reports a decrease in his tremors. Pt tremors rated at a level (1) on the CIWA scale. Pt denies any other withdrawal symptoms. Pt was present and active within the milieu. Pt is currently denying any SI/HI/AVH. Pt presents with an unsteady gait this evening.  A: Writer administered scheduled medications to pt, per MD orders. Continued support and availability as needed was extended to this pt. Staff continue to monitor pt with q7min checks.  R: No adverse drug reactions noted. Pt receptive to treatment. Pt remains safe at this time.

## 2014-01-23 MED ORDER — LORAZEPAM 1 MG PO TABS
1.0000 mg | ORAL_TABLET | Freq: Once | ORAL | Status: AC
  Administered 2014-01-23: 1 mg via ORAL
  Filled 2014-01-23: qty 1

## 2014-01-23 MED ORDER — NICOTINE 21 MG/24HR TD PT24
MEDICATED_PATCH | TRANSDERMAL | Status: AC
  Filled 2014-01-23: qty 1

## 2014-01-23 MED ORDER — CLONIDINE HCL 0.1 MG PO TABS
0.1000 mg | ORAL_TABLET | Freq: Once | ORAL | Status: AC
  Administered 2014-01-23: 0.1 mg via ORAL
  Filled 2014-01-23 (×2): qty 1

## 2014-01-23 MED ORDER — NICOTINE 21 MG/24HR TD PT24
21.0000 mg | MEDICATED_PATCH | Freq: Every day | TRANSDERMAL | Status: DC
  Administered 2014-01-23 – 2014-01-26 (×4): 21 mg via TRANSDERMAL
  Filled 2014-01-23 (×5): qty 1

## 2014-01-23 MED ORDER — ACAMPROSATE CALCIUM 333 MG PO TBEC
666.0000 mg | DELAYED_RELEASE_TABLET | Freq: Three times a day (TID) | ORAL | Status: DC
  Administered 2014-01-23 – 2014-01-26 (×9): 666 mg via ORAL
  Filled 2014-01-23 (×4): qty 2
  Filled 2014-01-23: qty 84
  Filled 2014-01-23 (×2): qty 2
  Filled 2014-01-23: qty 84
  Filled 2014-01-23 (×5): qty 2
  Filled 2014-01-23: qty 84

## 2014-01-23 NOTE — Progress Notes (Signed)
[facility] Adult Case Management Discharge Plan :  Will you be returning to the same living situation after discharge: Yes,  Patient is returning to his home. At discharge, do you have transportation home?:Yes,  Patient's sister to transport him home. Do you have the ability to pay for your medications:Yes,  Patient is able to obtain medicaitons.  Release of information consent forms completed and in the chart;  Patient's signature needed at discharge.  Patient to Follow up at: Follow-up Information   Follow up with [facility] On 01/27/2014. (You are scheduled with Nelda Severe on Tuesday, January 27, 2014 at [facility])    Contact information:   [address] [place]   [address]  [phone]      Patient denies SI/HI:  Patient no longer endorsing SI/HI or other thoughts of self harm.  Safety Planning and Suicide Prevention discussed:  .Reviewed with all patients during discharge planning group   Hridhaan Yohn, Eulas Post 01/23/2014, 12:55 PM

## 2014-01-23 NOTE — Progress Notes (Signed)
D: Pt presents with mild anxiety, but pleasant and cooperative with care. Med and group compliant. Visible in the milieu socializing with peers. Mild tremors noted from withdrawal. Denies SI/HI/AVH. Increased blood pressure. A:Morning medications given as Md ordered. Pt asymptomatic with HTN. Continued support of pt as needed. Encouraged to stay active in miliu with peers and to attend groups. Continued 15 min safety checks. R. Pt in good spirits, able to recognize and verbalize support persons when discharged home. No negative behaviors noted. Pt safe at present.

## 2014-01-23 NOTE — Tx Team (Signed)
Interdisciplinary Treatment Plan Update   Date Reviewed:  01/23/2014  Time Reviewed:  9:59 AM  Progress in Treatment:   Attending groups: Yes Participating in groups: Yes Taking medication as prescribed: Yes  Tolerating medication: Yes Family/Significant other contact made:  Yes, collateral contact with sister. Patient understands diagnosis: Yes, patient understands diagnosis and need for treatment. Discussing patient identified problems/goals with staff: Yes, patient is able to express goals for treatment and discharge. Medical problems stabilized or resolved: Yes Denies suicidal/homicidal ideation: Yes Patient has not harmed self or others: Yes  For review of initial/current patient goals, please see plan of care.  Estimated Length of Stay:  1- days Discharge Saturday  Reasons for Continued Hospitalization:  Anxiety Depression Medication stabilization   New Problems/Goals identified:    Discharge Plan or Barriers:   Home with outpatient follow up with [facility]  Additional Comments:   Continue stabilization  Patient and CSW reviewed patient's identified goals and treatment plan.  Patient verbalized understanding and agreed to treatment plan.   Attendees:  Patient:  01/23/2014 9:59 AM   Signature:  Gabriel Earing, MD 01/23/2014 9:59 AM  Signature: Carlton Adam, MD 01/23/2014 9:59 AM  Signature:  Drake Leach, RN 01/23/2014 9:59 AM  Signature:  Floyde Parkins, RN 01/23/2014 9:59 AM  Signature:   01/23/2014 9:59 AM  Signature:  Joette Catching, LCSW 01/23/2014 9:59 AM  Signature:  Erasmo Downer Drinkard, LCSW-A 01/23/2014 9:59 AM  Signature:  Lucinda Dell, Care Coordinator [facility] 01/23/2014 9:59 AM  Signature:   01/23/2014 9:59 AM  Signature:  01/23/2014  9:59 AM  Signature:   01/23/2014  9:59 AM  Signature:  [place] Worker LCSW 01/23/2014  9:59 AM    Scribe for Treatment Team:   [employer],  01/23/2014 9:59 AM

## 2014-01-23 NOTE — Progress Notes (Signed)
D: Pt presents anxious in affect and mood. Pt continues to have some mild tremors in relation to his withdrawal symptoms. Pt is observed in the dayroom interacting with the other patients. Pt is currently negative for any SI/HI/AVH. Pt actively participated within the milieu this morning. Pt had asymptomatic htn on Thursday evening. On-call extender was contacted and a Now order for Labetalol 100 mg was given.  A: Writer administered scheduled and prn medications to pt, per MD orders. Continued support and availability as needed was extended to this pt. Staff continue to monitor pt with q20min checks.  R: No adverse drug reactions noted. Pt receptive to treatment. Pt remains safe at this time.

## 2014-01-23 NOTE — Progress Notes (Signed)
Patient ID: Greg Lewis, male   DOB: 01-Jan-1951, 63 y.o.   MRN: [id number] [facility] MD Progress Note  01/23/2014 12:16 PM [name]  MRN:  [id number] Subjective:   Patient states " I guess I am doing OK"- no specific complaints. Objective: I have discussed case with treatment team and met with patient. Patient states he is feeling better, and at this time minimizes symptoms of withdrawal. He does not have significant distal tremors or diaphoresis. With patient's express consent I have spoken with his sister, who is his closest support system and who came to visit yesterday evening. She states she is concerned that he still seems to be " shaky " and she felt he still was not back at baseline. She states that she has been trying to convince him to go to an inpatient rehab, as he has history of frequent relapses at home. Staff has also encouraged patient to consider inpatient setting /rehab options, but patient not interested at this time, stating he wants to return home, but would agree to outpatient treatment. Social Worker has been looking at outpatient tx options in patient's community or nearby. Patient has been going to some groups, participation is limited.  No disruptive behaviors on unit.  Continues Ativan taper, scheduled to complete tomorrow.  Diagnosis:  Alcohol Dependence, Alcohol Withdrawal  Total Time spent with patient: 25 minutes    ADL's: improved   Sleep: improved   Appetite:improved  Suicidal Ideation:  Denies any suicidal ideations at present  Homicidal Ideation:  Denies any homicidal ideations AEB (as evidenced by):  Psychiatric Specialty Exam: Physical Exam  Review of Systems  Constitutional: Negative for fever and chills.  Respiratory: Negative for cough and shortness of breath.   Cardiovascular: Negative for chest pain.  Gastrointestinal: Negative for nausea and vomiting.  Genitourinary: Negative for dysuria, urgency and frequency.  Skin: Negative  for rash.  Neurological: Positive for tremors. Negative for seizures.  Psychiatric/Behavioral: Positive for suicidal ideas and substance abuse. The patient is nervous/anxious.     Blood pressure 133/94, pulse 108, temperature 97.4 F (36.3 C), temperature source Oral, resp. rate 16, height 5' 7.75" (1.721 m), weight 62.483 kg (137 lb 12 oz).Body mass index is 21.1 kg/(m^2).  General Appearance: Fairly Groomed  Engineer, water::  Good  Speech:  Normal Rate  Volume:  Normal  Mood:  no depression. States he feels "OK"  Affect:  slightly anxious, but reactive  Thought Process:  Goal Directed and Linear  Orientation:  Full (Time, Place, and Person)  Thought Content:  denies hallucinations, no visual disturbances  Suicidal Thoughts:  No denies hallucinations, no delusions  Homicidal Thoughts:  No  Memory:  recent and remote grossly intact   Judgement:  Fair  Insight:  Fair  Psychomotor Activity:  Normal  Concentration:  Good  Recall:  Good  Fund of Knowledge:Good  Language: Good  Akathisia:  Negative  Handed:  Right  AIMS (if indicated):     Assets:  Desire for Improvement Resilience  Sleep:  Number of Hours: 6.5   Musculoskeletal: Strength & Muscle Tone: within normal limits Gait & Station: normal Patient leans: N/A  Current Medications: Current Facility-Administered Medications  Medication Dose Route Frequency Provider Last Rate Last Dose  . acetaminophen (TYLENOL) tablet 650 mg  650 mg Oral Q6H PRN Elmarie Shiley, NP      . alum & mag hydroxide-simeth (MAALOX/MYLANTA) 200-200-20 MG/5ML suspension 30 mL  30 mL Oral Q4H PRN Elmarie Shiley, NP      .  lisinopril (PRINIVIL,ZESTRIL) tablet 20 mg  20 mg Oral Daily Nicholaus Bloom, MD   20 mg at 01/23/14 0900   And  . hydrochlorothiazide (HYDRODIURIL) tablet 25 mg  25 mg Oral Daily Nicholaus Bloom, MD   25 mg at 01/23/14 0900  . hydrOXYzine (ATARAX/VISTARIL) tablet 25 mg  25 mg Oral Q6H PRN Elmarie Shiley, NP      . loperamide (IMODIUM) capsule 2-4  mg  2-4 mg Oral PRN Elmarie Shiley, NP      . LORazepam (ATIVAN) tablet 1 mg  1 mg Oral Q6H PRN Elmarie Shiley, NP      . LORazepam (ATIVAN) tablet 1 mg  1 mg Oral BID Elmarie Shiley, NP   1 mg at 01/23/14 0900   Followed by  . [START ON 01/24/2014] LORazepam (ATIVAN) tablet 1 mg  1 mg Oral Daily Elmarie Shiley, NP      . magnesium hydroxide (MILK OF MAGNESIA) suspension 30 mL  30 mL Oral Daily PRN Elmarie Shiley, NP      . multivitamin with minerals tablet 1 tablet  1 tablet Oral Daily Elmarie Shiley, NP   1 tablet at 01/23/14 0900  . ondansetron (ZOFRAN-ODT) disintegrating tablet 4 mg  4 mg Oral Q6H PRN Elmarie Shiley, NP      . thiamine (B-1) injection 100 mg  100 mg Intramuscular Once Elmarie Shiley, NP      . thiamine (VITAMIN B-1) tablet 100 mg  100 mg Oral Daily Elmarie Shiley, NP   100 mg at 01/23/14 0900  . traZODone (DESYREL) tablet 50 mg  50 mg Oral QHS PRN Elmarie Shiley, NP   50 mg at 01/22/14 2228    Lab Results: No results found for this or any previous visit (from the past 48 hour(s)).  Physical Findings: AIMS: Facial and Oral Movements Muscles of Facial Expression: None, normal Lips and Perioral Area: None, normal Jaw: None, normal Tongue: None, normal,Extremity Movements Upper (arms, wrists, hands, fingers): None, normal Lower (legs, knees, ankles, toes): None, normal, Trunk Movements Neck, shoulders, hips: None, normal, Overall Severity Severity of abnormal movements (highest score from questions above): None, normal Incapacitation due to abnormal movements: None, normal Patient's awareness of abnormal movements (rate only patient's report): No Awareness, Dental Status Current problems with teeth and/or dentures?: No Does patient usually wear dentures?: No  CIWA:  CIWA-Ar Total: 2 COWS:  COWS Total Score: 6  Assessment-  Patient continues to gradually improve and at this time does not present with any significant withdrawal symptoms, as he approaches completion of standing Ativan  taper. Sister, who has visited him regularly, is concerned about residual withdrawal symptoms and potential for relapse upon discharge. She and staff have encouraged patient to consider Rehab setting after discharge but at this time patient not interested. We discussed medication options- antabuse not considered appropriate due to age, health status, and potential for relapse on this medication- severe disulfiram reaction. He does agree to Campral to help decrease cravings.  Treatment Plan Summary: Daily contact with patient to assess and evaluate symptoms and progress in treatment Medication management See below  Plan: Continue inpatient treatment. Continue to provide milieu/support Continue Ativan taper as per alcohol detox protocol Start CAMPRAL 666 mgrs TID  Medical Decision Making Problem Points:  Established problem, stable/improving (1), Review of last therapy session (1) and Review of psycho-social stressors (1) Data Points:  Review of new medications or change in dosage (2)  I certify that inpatient services furnished can reasonably be expected to  improve the patient's condition.   Zoey Bidwell, [place] 01/23/2014, 12:16 PM

## 2014-01-23 NOTE — Plan of Care (Signed)
Problem: Ineffective individual coping Goal: STG: Patient will remain free from self harm Outcome: Progressing Pt verbally denied suicidal thoughts during assessment Goal: STG: Patient will participate in after care plan Patient will attend attend groups and engage in discussion. Outpatient follow up appointment will be scheduled.  Burnis Medin Hodnett, LCSW 01/20/2014 3:41 PM  Outcome: Progressing Pt did attend morning group

## 2014-01-23 NOTE — BHH Group Notes (Signed)
[facility] LCSW Aftercare Discharge Planning Group Note   01/23/2014 12:53 PM    Participation Quality:  Appropraite  Mood/Affect:  Appropriate  Depression Rating:  2  Anxiety Rating:  2  Thoughts of Suicide:  No  Will you contract for safety?   NA  Current AVH:  No  Plan for Discharge/Comments:  Patient attended discharge planning group and actively participated in group. Patient reports doing well and hopes to discharge home soon.  He was advised outpatient follow up scheduled in [place], [place] with [facility]  Suicide prevention education reviewed and SPE document provided.   Transportation Means: Patient has transportation.   Supports:  Patient has a support system.   Greg Lewis, [place] Post

## 2014-01-23 NOTE — Progress Notes (Signed)
Adult Psychoeducational Group Note  Date:  01/23/2014 Time:  1:48 PM  Group Topic/Focus:  Goals Group:   The focus of this group is to help patients establish daily goals to achieve during treatment and discuss how the patient can incorporate goal setting into their daily lives to aide in recovery.  Participation Level:  Did Not Attend  Participation Quality:  did not attend  Affect:  did not attend  Cognitive:  did not attend  Insight: None  Engagement in Group:  did not attend  Modes of Intervention:  Discussion  Additional Comments:  Pt did not attend group  Ardenia Stiner, [place] 01/23/2014, 1:48 PM

## 2014-01-23 NOTE — Progress Notes (Signed)
D: Pt has anxious affect and mood.  Pt reports his day was "terrible.  I feel like everything I tried to do today I wasn't able to do it."  When pt was asked for an example, pt stated "I overslept this morning and didn't make it to breakfast."  Pt denies SI/HI, denies hallucinations, denies pain.  Pt interacts with staff and peers appropriately.  Attended evening group.  Pt has tremors from withdrawal.  Pt's blood pressure was high this evening, pt reports "it always runs high."     A: Met with pt 1:1 and provided support and encouragement.  On call provider notified of pt's blood pressure.  Ativan 9m POX1 and Clonidine 0.118mPOX1 ordered and administered.  Scheduled medications administered per order.  Safety maintained. R: Pt is compliant with medications.  He is in no distress and verbally contracts for safety.  Will continue to monitor and assess for safety.

## 2014-01-24 NOTE — Progress Notes (Signed)
D) Pt has attended the groups and interacts minimally with his peers. Pt has a hearing problem and it is difficult for him to hear in a large group. Pt denies SI and HI and states that his depression, hopelessness and helplessness are all good. "I am feeling good". Pt's family called and felt that Pt was not ready to come home. Would sit in groups and listen but was not always on target with his response due to his inability to hear everything A) Given support, reassurance and praise along with encouragement. Provided with a 1:1. R) Pt denies SI and HI.

## 2014-01-24 NOTE — Progress Notes (Signed)
[facility] Group Notes:  (Nursing/MHT/Case Management/Adjunct)  Date:  01/24/2014  Time:  11:14 PM  Type of Therapy:  Psychoeducational Skills  Participation Level:  Active  Participation Quality:  Attentive  Affect:  Appropriate  Cognitive:  Lacking  Insight:  Lacking  Engagement in Group:  Developing/Improving  Modes of Intervention:  Education  Summary of Progress/Problems: The patient attended group this evening and verbalized that he had a great day. He did not provided any details about his day other then he is ready to return to work. In terms of the theme for the day, his coping skill is to return to his job.   Anan Dapolito S 01/24/2014, 11:14 PM

## 2014-01-24 NOTE — Progress Notes (Signed)
Pt's blood pressure was rechecked at 0100 and was 102/76 with a pulse of 78.  Will continue to monitor and assess for safety.

## 2014-01-24 NOTE — Plan of Care (Signed)
Problem: Diagnosis: Increased Risk For Suicide Attempt Goal: STG-Patient Will Attend All Groups On The Unit Outcome: Progressing Patient attended group this evening     

## 2014-01-24 NOTE — Plan of Care (Signed)
Problem: Diagnosis: Increased Risk For Suicide Attempt Goal: LTG-Patient Will Report Absence of Withdrawal Symptoms LTG (by discharge): Patient will report absence of withdrawal symptoms.  Outcome: Progressing Patient denies experiencing any withdrawal symptoms.

## 2014-01-24 NOTE — Progress Notes (Signed)
Patient attended [place] meeting. Tonight we had a speaker meeting. They actively listened. He shared that his father and brother are alcoholics, and he told about how difficult that was to grow up in that environment. He was cooperative on the unit. His blood pressure was elevated this evening. This was reported to his nurse who closely monitored and addressed it.

## 2014-01-24 NOTE — Progress Notes (Signed)
Patient has been up and active on the unit, plans to attend group this evening and has voiced no complaints. Patient currently denies having pain, -si/hi/a/v hall. Support and encouragement offered, safety maintained on unit, will continue to monitor.

## 2014-01-24 NOTE — BHH Group Notes (Signed)
[facility] LCSW Group Therapy  01/24/2014 4:00 PM  Type of Therapy:  Group Therapy  Participation Level:  Active  Participation Quality:  Attentive  Affect:  Appropriate  Cognitive:  Alert  Insight:  Developing/Improving  Engagement in Therapy:  Engaged  Modes of Intervention:  Discussion  Summary of Progress/Problems:Group today was building coping skills by participants and how to use those coping mechanisms. Group began by defining coping mechanisms and then identify historical examples. There were multiple examples shared that others could find useful as a coping mechanism with tools for application. Patient was able to share how he used his coping mechanisms and learn from the coping mechanisms of the other group members.  Christene Lye MSW, LCSW  Lyla Glassing 01/24/2014, 4:00 PM

## 2014-01-24 NOTE — Plan of Care (Signed)
Problem: Alteration in mood & ability to function due to Goal: STG-Patient will attend groups Outcome: Progressing Pt attended evening group on 01/23/14.  Problem: Diagnosis: Increased Risk For Suicide Attempt Goal: STG-Patient Will Comply With Medication Regime Outcome: Progressing Pt has been compliant with all medications this shift.

## 2014-01-24 NOTE — Progress Notes (Signed)
Patient ID: Greg Lewis, male   DOB: [birth year], 63 y.o.   MRN: [id number] [facility] MD Progress Note  01/24/2014 4:39 PM Fleming Prill  MRN:  [id number] Subjective:   Patient states he is feeling " pretty good". He denies any current withdrawal symptoms.  He states " I really want to go home soon"  Objective: Patient continues to gradually improve. His grooming is better, he states he has gained some weight back, his ambulation is improved, and he is not currently presenting with any withdrawal symptoms . No significant tremors or diaphoresis noted and does not appear to be in any acute distress. As noted in prior notes, patient's sister had expressed concerns about patient having ongoing withdrawal symptoms when she last visited. At this time patient is not presenting with any significant withdrawal symptoms. She has expressed concern about patient's high risk of relapse and both sister and staff have encouraged patient to consider going to an inpatient rehab setting after discharge , but patient currently not interested. He states " I'll go home first, and if I feel I need it , then I will go" Patient tolerating medications well. Denies medication side effects. He was started on Campral yesterday. Well tolerated thus far and denies side effects. Behavior on unit in good control. Diagnosis:  Alcohol Dependence, Alcohol Withdrawal  Total Time spent with patient: 25 minutes    ADL's: improved   Sleep: improved   Appetite:improved  Suicidal Ideation:  Denies any suicidal ideations at present  Homicidal Ideation:  Denies any homicidal ideations AEB (as evidenced by):  Psychiatric Specialty Exam: Physical Exam  Review of Systems  Constitutional: Negative for fever and chills.  Respiratory: Negative for cough and shortness of breath.   Cardiovascular: Negative for chest pain.  Gastrointestinal: Negative for nausea and vomiting.  Genitourinary: Negative for dysuria, urgency and  frequency.  Skin: Negative for rash.  Neurological: Positive for tremors. Negative for seizures.  Psychiatric/Behavioral: Positive for suicidal ideas and substance abuse. The patient is nervous/anxious.     Blood pressure 156/96, pulse 99, temperature 97.9 F (36.6 C), temperature source Oral, resp. rate 18, height 5' 7.75" (1.721 m), weight 62.483 kg (137 lb 12 oz), SpO2 100.00%.Body mass index is 21.1 kg/(m^2).  General Appearance: improved grooming  Eye Contact::  Good  Speech:  Normal Rate  Volume:  Normal  Mood:  denies depression, states mood is "OK"  Affect:  Appropriate  Thought Process:  Goal Directed and Linear  Orientation:  Full (Time, Place, and Person)  Thought Content:  denies hallucinations, no visual disturbances  Suicidal Thoughts:  No denies hallucinations, no delusions  Homicidal Thoughts:  No  Memory:  recent and remote grossly intact   Judgement:  Fair  Insight:  Fair  Psychomotor Activity:  Normal  Concentration:  Good  Recall:  Good  Fund of Knowledge:Good  Language: Good  Akathisia:  Negative  Handed:  Right  AIMS (if indicated):     Assets:  Desire for Improvement Resilience  Sleep:  Number of Hours: 6.5   Musculoskeletal: Strength & Muscle Tone: within normal limits- at this time no tremors, no diaphoresis, no psychomotor agitation Gait & Station: normal Patient leans: N/A  Current Medications: Current Facility-Administered Medications  Medication Dose Route Frequency Provider Last Rate Last Dose  . acamprosate (CAMPRAL) tablet 666 mg  666 mg Oral TID WC Neita Garnet, MD   666 mg at 01/24/14 1203  . acetaminophen (TYLENOL) tablet 650 mg  650 mg Oral Q6H PRN Elmarie Shiley,  NP      . alum & mag hydroxide-simeth (MAALOX/MYLANTA) 200-200-20 MG/5ML suspension 30 mL  30 mL Oral Q4H PRN Elmarie Shiley, NP      . lisinopril (PRINIVIL,ZESTRIL) tablet 20 mg  20 mg Oral Daily Nicholaus Bloom, MD   20 mg at 01/24/14 0825   And  . hydrochlorothiazide  (HYDRODIURIL) tablet 25 mg  25 mg Oral Daily Nicholaus Bloom, MD   25 mg at 01/24/14 0825  . magnesium hydroxide (MILK OF MAGNESIA) suspension 30 mL  30 mL Oral Daily PRN Elmarie Shiley, NP      . multivitamin with minerals tablet 1 tablet  1 tablet Oral Daily Elmarie Shiley, NP   1 tablet at 01/24/14 0825  . nicotine (NICODERM CQ - dosed in mg/24 hours) patch 21 mg  21 mg Transdermal Q0600 Nicholaus Bloom, MD   21 mg at 01/24/14 9381  . thiamine (B-1) injection 100 mg  100 mg Intramuscular Once Elmarie Shiley, NP      . thiamine (VITAMIN B-1) tablet 100 mg  100 mg Oral Daily Elmarie Shiley, NP   100 mg at 01/24/14 0825  . traZODone (DESYREL) tablet 50 mg  50 mg Oral QHS PRN Elmarie Shiley, NP   50 mg at 01/23/14 2233    Lab Results: No results found for this or any previous visit (from the past 48 hour(s)).  Physical Findings: AIMS: Facial and Oral Movements Muscles of Facial Expression: None, normal Lips and Perioral Area: None, normal Jaw: None, normal Tongue: None, normal,Extremity Movements Upper (arms, wrists, hands, fingers): None, normal Lower (legs, knees, ankles, toes): None, normal, Trunk Movements Neck, shoulders, hips: None, normal, Overall Severity Severity of abnormal movements (highest score from questions above): None, normal Incapacitation due to abnormal movements: None, normal Patient's awareness of abnormal movements (rate only patient's report): No Awareness, Dental Status Current problems with teeth and/or dentures?: No Does patient usually wear dentures?: No  CIWA:  CIWA-Ar Total: 5 COWS:  COWS Total Score: 6  Assessment-  Patient continues to improve, with no significant withdrawal symptoms at this time. He has completed Ativan detox protocol today, and detox has been uneventful. Patient remains reluctant to go to a Rehab.  Treatment Plan Summary: Daily contact with patient to assess and evaluate symptoms and progress in treatment Medication management See  below  Plan: Continue inpatient treatment. *Patient states he plans to call his sister to see if she can pick him up tomorrow - he resides in [place], and states she would be his only transportation home.  States he will go to outpatient services- not interested in Rehab at this time Continue Campral 666 mgrs TID  Medical Decision Making Problem Points:  Established problem, stable/improving (1), Review of last therapy session (1) and Review of psycho-social stressors (1) Data Points:  Review of medication regiment & side effects (2)  I certify that inpatient services furnished can reasonably be expected to improve the patient's condition.   Karimah Winquist, [place] 01/24/2014, 4:39 PM

## 2014-01-25 DIAGNOSIS — F1010 Alcohol abuse, uncomplicated: Secondary | ICD-10-CM

## 2014-01-25 DIAGNOSIS — I10 Essential (primary) hypertension: Secondary | ICD-10-CM | POA: Diagnosis present

## 2014-01-25 LAB — PRO B NATRIURETIC PEPTIDE: Pro B Natriuretic peptide (BNP): 51.4 pg/mL (ref 0–125)

## 2014-01-25 MED ORDER — LABETALOL HCL 100 MG PO TABS
100.0000 mg | ORAL_TABLET | Freq: Once | ORAL | Status: AC
  Administered 2014-01-25: 100 mg via ORAL
  Filled 2014-01-25 (×2): qty 1

## 2014-01-25 MED ORDER — METOPROLOL TARTRATE 25 MG PO TABS
25.0000 mg | ORAL_TABLET | Freq: Two times a day (BID) | ORAL | Status: DC
  Administered 2014-01-25 – 2014-01-26 (×2): 25 mg via ORAL
  Filled 2014-01-25 (×2): qty 1
  Filled 2014-01-25: qty 28
  Filled 2014-01-25: qty 1
  Filled 2014-01-25: qty 28
  Filled 2014-01-25 (×3): qty 1

## 2014-01-25 NOTE — Progress Notes (Signed)
Psychoeducational Group Note  Late entry on 01-25-2014 for 01-24-2014  Date:01/24/2014  Time:  1015  Group Topic/Focus:  Identifying Needs:   The focus of this group is to help patients identify their personal needs that have been historically problematic and identify healthy behaviors to address their needs.  Participation Level:  Active  Participation Quality:  Appropriate  Affect:  Appropriate  Cognitive:  Oriented  Insight:  Improving  Engagement in Group:  Engaged  Additional Comments:  Paulino Rily

## 2014-01-25 NOTE — Progress Notes (Signed)
Psychoeducational Group Note  Date: 01/25/2014 Time:  1015  Group Topic/Focus:  Identifying Needs:   The focus of this group is to help patients identify their personal needs that have been historically problematic and identify healthy behaviors to address their needs.  Participation Level:  Active  Participation Quality:  Appropriate  Affect:  Appropriate  Cognitive:  Oriented  Insight:  Improving  Engagement in Group:  Engaged  Additional Comments:    Paulino Rily

## 2014-01-25 NOTE — BHH Group Notes (Signed)
[facility] LCSW Group Therapy 01/25/2014 10:00am  Type of Therapy: Group Therapy- Feelings Around Discharge & Establishing a Supportive Framework  Participation Level: Minimal  Participation Quality:  Appropriate  Affect:  Appropriate   Cognitive: Alert and Oriented   Insight:  Developing/Improving   Engagement in Therapy: Developing/Improving and Engaged   Modes of Intervention: Clarification, Confrontation, Discussion, Education, Exploration, Limit-setting, Orientation, Problem-solving, Rapport Building, Art therapist, Socialization and Support   Description of Group:   What is a supportive framework? What does it look like feel like and how do I discern it from and unhealthy non-supportive network? Learn how to cope when supports are not helpful and don't support you. Discuss what to do when your family/friends are not supportive. Pt did not participate in group discussion about developing a positive support network but was attentive to comments of peers.    Therapeutic Modalities:   Cognitive Behavioral Therapy Person-Centered Therapy Motivational Interviewing   Peri Maris, [place] 01/25/2014 12:43 PM

## 2014-01-25 NOTE — Progress Notes (Signed)
.  Psychoeducational Group Note  Late entry for 01-24-14 at 0900  Date: 01/25/2014 Time:  0900   Goal Setting Purpose of Group: To be able to set a goal that is measurable and that can be accomplished in one day Participation Level:  Active  Participation Quality:  Appropriate  Affect:  Depressed  Cognitive:  Oriented  Insight:  Improving  Engagement in Group:  Engaged  Additional Comments:   Paulino Rily

## 2014-01-25 NOTE — Progress Notes (Signed)
Patient ID: Greg Lewis, male   DOB: [birth year], 63 y.o.   MRN: [id number] [facility] MD Progress Note  01/25/2014 3:24 PM Greg Lewis  MRN:  [id number] Subjective:   Patient states he is "OK" , but feels " tired" today. Objective: Patient states that he spoke with his sister, and states " there is no way she can come in to pick me up today. She will be here tomorrow". Patient states that her sister is the only transportation he has available to go back to [place], where he resides. Patient has been going to groups, and is participative in milieu. At this time denies any lingering symptoms of withdrawal , and does not appear to be in any acute distress or discomfort. He does have persistently elevated BP, but this not felt to be related to withdrawal, but to his underlying history of HTN.  He is on Lisinopril and HCTZ.  Behavior on unit is in good control. No medication side effects reported.  Diagnosis:  Alcohol Dependence, Alcohol Withdrawal  Total Time spent with patient: 25 minutes    ADL's: improved   Sleep: improved   Appetite:improved  Suicidal Ideation:  Denies any suicidal ideations at present  Homicidal Ideation:  Denies any homicidal ideations AEB (as evidenced by):  Psychiatric Specialty Exam: Physical Exam  Review of Systems  Constitutional: Negative for fever and chills.  Respiratory: Negative for cough and shortness of breath.   Cardiovascular: Negative for chest pain.  Gastrointestinal: Negative for nausea and vomiting.  Genitourinary: Negative for dysuria, urgency and frequency.  Skin: Negative for rash.  Neurological: Negative for seizures.  Psychiatric/Behavioral: Positive for substance abuse. The patient is nervous/anxious.     Blood pressure 142/102, pulse 102, temperature 98.6 F (37 C), temperature source Oral, resp. rate 18, height 5' 7.75" (1.721 m), weight 62.483 kg (137 lb 12 oz), SpO2 100 %.Body mass index is 21.1 kg/(m^2).  General Appearance:  improved grooming  Eye Contact::  Good  Speech:  Normal Rate  Volume:  Normal  Mood:  denies depression, states mood is "OK" - today seems euthymic  Affect:  Appropriate  Thought Process:  Goal Directed and Linear  Orientation:  Full (Time, Place, and Person)  Thought Content:  denies hallucinations, no visual disturbances  Suicidal Thoughts:  No denies hallucinations, no delusions  Homicidal Thoughts:  No  Memory:  recent and remote grossly intact   Judgement:  Other:  improved   Insight:  Fair  Psychomotor Activity:  Normal  Concentration:  Good  Recall:  Good  Fund of Knowledge:Good  Language: Good  Akathisia:  Negative  Handed:  Right  AIMS (if indicated):     Assets:  Desire for Improvement Resilience  Sleep:  Number of Hours: 7.25   Musculoskeletal: Strength & Muscle Tone: within normal limits- at this time no tremors, no diaphoresis, no psychomotor agitation Gait & Station: normal Patient leans: N/A  Current Medications: Current Facility-Administered Medications  Medication Dose Route Frequency Provider Last Rate Last Dose  . acamprosate (CAMPRAL) tablet 666 mg  666 mg Oral TID WC Jenne Campus, MD   666 mg at 01/25/14 1212  . acetaminophen (TYLENOL) tablet 650 mg  650 mg Oral Q6H PRN Elmarie Shiley, NP      . alum & mag hydroxide-simeth (MAALOX/MYLANTA) 200-200-20 MG/5ML suspension 30 mL  30 mL Oral Q4H PRN Elmarie Shiley, NP      . lisinopril (PRINIVIL,ZESTRIL) tablet 20 mg  20 mg Oral Daily Nicholaus Bloom, MD  20 mg at 01/25/14 0745   And  . hydrochlorothiazide (HYDRODIURIL) tablet 25 mg  25 mg Oral Daily Nicholaus Bloom, MD   25 mg at 01/25/14 0745  . magnesium hydroxide (MILK OF MAGNESIA) suspension 30 mL  30 mL Oral Daily PRN Elmarie Shiley, NP      . multivitamin with minerals tablet 1 tablet  1 tablet Oral Daily Elmarie Shiley, NP   1 tablet at 01/25/14 0745  . nicotine (NICODERM CQ - dosed in mg/24 hours) patch 21 mg  21 mg Transdermal Q0600 Nicholaus Bloom, MD   21 mg at  01/25/14 0636  . thiamine (B-1) injection 100 mg  100 mg Intramuscular Once Elmarie Shiley, NP   100 mg at 01/20/14 1345  . thiamine (VITAMIN B-1) tablet 100 mg  100 mg Oral Daily Elmarie Shiley, NP   100 mg at 01/25/14 0745  . traZODone (DESYREL) tablet 50 mg  50 mg Oral QHS PRN Elmarie Shiley, NP   50 mg at 01/24/14 2221    Lab Results: No results found for this or any previous visit (from the past 48 hour(s)).  Physical Findings: AIMS: Facial and Oral Movements Muscles of Facial Expression: None, normal Lips and Perioral Area: None, normal Jaw: None, normal Tongue: None, normal,Extremity Movements Upper (arms, wrists, hands, fingers): None, normal Lower (legs, knees, ankles, toes): None, normal, Trunk Movements Neck, shoulders, hips: None, normal, Overall Severity Severity of abnormal movements (highest score from questions above): None, normal Incapacitation due to abnormal movements: None, normal Patient's awareness of abnormal movements (rate only patient's report): No Awareness, Dental Status Current problems with teeth and/or dentures?: No Does patient usually wear dentures?: No  CIWA:  CIWA-Ar Total: 1 COWS:  COWS Total Score: 6  Assessment-  Currently patient is improved compared to admission. He does not present with any ongoing symptoms of withdrawal. BP does remain elevated, and this is felt to be related to underlying HTN rather than ongoing WDL. Was hoping for discharge today, but spoke with sister and has no transportation available to go home to [place] until his sister picks him up tomorrow. Tolerating medications well.  Treatment Plan Summary: Daily contact with patient to assess and evaluate symptoms and progress in treatment Medication management See below  Plan: Continue inpatient treatment. Likely Discharge tomorrow. Will request Hospitalist Consult to review BP and consider optimizing antihypertensive treatment. Continue Campral 666 mgrs TID  Medical Decision  Making Problem Points:  Established problem, stable/improving (1), Review of last therapy session (1) and Review of psycho-social stressors (1) Data Points:  Review of medication regiment & side effects (2)  I certify that inpatient services furnished can reasonably be expected to improve the patient's condition.   COBOS, [place] 01/25/2014, 3:24 PM

## 2014-01-25 NOTE — Consult Note (Addendum)
[facility] Medical Consultation  Greg Lewis ZSW:[id number] DOB: 02-18-1951 DOA: 01/20/2014 PCP: No primary care provider on file.   Requesting physician: Nance Pew, Psychiatry Date of consultation: 01/25/14 Reason for consultation: blood pressure elevated  Impression/Recommendations Active Problems:   Alcohol abuse: As managed by psychiatry  elevated blood pressurecolon already on lisinopril/HCTZ. Patient has mostly finished his alcohol withdrawals. Still tachycardic with diastolic blood pressure closer to 100. We'll go ahead and start beta blocker, metoprolol at 25 twice a day. Initially patient resistant to making any changes in his medication regimen, but with that degree of tachycardia, we'll go ahead and start beta blocker here. On discharge, will recommend patient get prescription for beta blocker and advised to fill it. He states he will follow up with his primary care doc at urgent care.  At this time will not follow-up unless needed. Given age and chronic alcohol use, we'll go ahead and check a BNP to ensure that he does not have any underlying heart failure from alcoholic cardiomyopathy   HPI:  63 year old male with past history of hypertension and daily alcohol use presented to the behavioral health  Hospital for admission on 10/20 for alcohol detoxification.on admission, blood work was normal he went through alcohol withdrawals during his hospitalization. He has been overall doing well, however he has remained tachycardic with an elevated blood pressure systolic 322G and diastolic in the 254Y. This has been discussed by continuing his home medication of ACE inhibitor/diuretic. Hospitalists were called for consultation.  Review of Systems:  Patient seen at behavioral health. Doing okay. Denies any headaches, vision changes, chest pain, palpitations, shortness of breath, wheeze, cough, abdominal pain, hematuria, dysuria, constipation, diarrhea, focal extremity numbness  or weakness or pain.  Past Medical History  Diagnosis Date  . Hypertension   . ETOH abuse   . Cancer     colon   Past Surgical History  Procedure Laterality Date  . Colonoscopy w/ biopsies and polypectomy     Social History:  reports that he has been smoking Cigarettes.  He has been smoking about 1.00 pack per day. He does not have any smokeless tobacco history on file. He reports that he drinks about 50.4 oz of alcohol per week. He reports that he does not use illicit drugs. patient lives at home and is able to participate in all activities of daily living without assistance  Allergies  Allergen Reactions  . Cabbage Other (See Comments)    Said it was bout lethal   History reviewed. No pertinent family history.  Prior to Admission medications   Medication Sig Start Date End Date Taking? Authorizing Provider  lisinopril-hydrochlorothiazide (PRINZIDE,ZESTORETIC) 20-25 MG per tablet Take 1 tablet by mouth daily.   Yes Historical Provider, MD  Multiple Vitamin (MULTIVITAMIN WITH MINERALS) TABS tablet Take 1 tablet by mouth daily.   Yes Historical Provider, MD   Physical Exam: Blood pressure 147/93, pulse 120, temperature 98.6 F (37 C), temperature source Oral, resp. rate 18, height 5' 7.75" (1.721 m), weight 62.483 kg (137 lb 12 oz), SpO2 100 %. Filed Vitals:   01/25/14 1701  BP: 147/93  Pulse: 120  Temp:   Resp:      General:  Alert and oriented 3, no acute distress  Eyes: sclera nonicteric, extraocular movements are intact  ENT: normocephalic, atraumatic, mucous members are moist  Neck: no JVD  Cardiovascular: regular rhythm, tachycardia, occasional ectopic beat  Respiratory: clear to auscultation bilaterally  Abdomen: soft, nontender, nondistended, positive bowel sounds  Skin: no  skin breaks, tears or lesions  Musculoskeletal: no clubbing or cyanosis or edema  Psychiatric: patient is appropriate, no acute psychoses  Neurologic: no overt deficits  Labs on  Admission:  Basic Metabolic Panel:  Recent Labs Lab 01/19/14 2225  NA 141  K 4.2  CL 99  CO2 27  GLUCOSE 77  BUN 7  CREATININE 0.66  CALCIUM 8.6   Liver Function Tests:  Recent Labs Lab 01/19/14 2225  AST 101*  ALT 44  ALKPHOS 117  BILITOT 0.6  PROT 7.7  ALBUMIN 4.0   No results for input(s): LIPASE, AMYLASE in the last 168 hours. No results for input(s): AMMONIA in the last 168 hours. CBC:  Recent Labs Lab 01/19/14 2225  WBC 5.2  HGB 17.7*  HCT 50.2  MCV 96.5  PLT 90*    Radiological Exams on Admission: No results found.  EKG: Independently reviewed. Sinus tachycardia  Time spent: 25 minutes  [facility] Pager [phone]  If 7PM-7AM, please contact night-coverage [email or web address] Password [email or web address] 01/25/2014, 5:17 PM

## 2014-01-25 NOTE — Progress Notes (Signed)
D) Pt continues to spend a lot of time in the dayroom with his peers. States "My wife is very mean to me and makes me feel bad. I wish someone could do something about that". Has been attending the group, but Pt is not always responding appropriately to the questions asked. Not sure if this is because of Pt's hearing loss or due to a lack of understanding on his part. Denies SI and HI. A) Pt given support, reassurance and praise. Provided a 1:1. R) Pt is quietly sitting in with his peers watching the flltball game on the TV.

## 2014-01-26 DIAGNOSIS — F1024 Alcohol dependence with alcohol-induced mood disorder: Secondary | ICD-10-CM | POA: Insufficient documentation

## 2014-01-26 DIAGNOSIS — F1919 Other psychoactive substance abuse with unspecified psychoactive substance-induced disorder: Secondary | ICD-10-CM

## 2014-01-26 MED ORDER — ACAMPROSATE CALCIUM 333 MG PO TBEC: 666.0000 mg | DELAYED_RELEASE_TABLET | Freq: Three times a day (TID) | ORAL | Status: DC

## 2014-01-26 MED ORDER — METOPROLOL TARTRATE 25 MG PO TABS: 25.0000 mg | ORAL_TABLET | Freq: Two times a day (BID) | ORAL | Status: DC

## 2014-01-26 MED ORDER — TRAZODONE HCL 50 MG PO TABS: 50.0000 mg | ORAL_TABLET | Freq: Every evening | ORAL | Status: DC | PRN

## 2014-01-26 MED ORDER — LISINOPRIL-HYDROCHLOROTHIAZIDE 20-25 MG PO TABS: 1.0000 | ORAL_TABLET | Freq: Every day | ORAL | Status: DC

## 2014-01-26 NOTE — Progress Notes (Signed)
Patient discharged per physician order; patient denies SI/HI and A/V hallucinations; patient received samples, copy of AVS, and prescriptions after it was reviewed; patient had no other questions or concerns at this time; patient verbalized and signed that he received all belongings; patient left the unit ambulatory

## 2014-01-26 NOTE — Progress Notes (Signed)
Patient did attend the evening speaker AA meeting.  

## 2014-01-26 NOTE — BHH Group Notes (Signed)
[place] LCSW Group Therapy 01/26/2014  1:15 pm  Type of Therapy: Group Therapy Participation Level: Active  Participation Quality: Attentive, Sharing and Supportive  Affect: Depressed and Flat  Cognitive: Alert and Oriented  Insight: Developing/Improving and Engaged  Engagement in Therapy: Developing/Improving and Engaged  Modes of Intervention: Clarification, Confrontation, Discussion, Education, Exploration,  Limit-setting, Orientation, Problem-solving, Rapport Building, Art therapist, Socialization and Support  Summary of Progress/Problems: Pt identified obstacles faced currently and processed barriers involved in overcoming these obstacles. Pt identified steps necessary for overcoming these obstacles and explored motivation (internal and external) for facing these difficulties head on. Pt further identified one area of concern in their lives and chose a goal to focus on for today. Patient identified his primary obstacle as maintaining sobriety and finding time for himself. CSW emphasized the importance of setting boundaries at work. CSW provided emotional support and encouragement to patient.  Tilden Fossa, MSW, [place] Worker [facility] [phone]

## 2014-01-26 NOTE — BHH Suicide Risk Assessment (Signed)
Suicide Risk Assessment  Discharge Assessment     Demographic Factors:  Male and Caucasian  Total Time spent with patient: 30 minutes  Psychiatric Specialty Exam:     Blood pressure 131/78, pulse 72, temperature 98.4 F (36.9 C), temperature source Oral, resp. rate 24, height 5' 7.75" (1.721 m), weight 62.483 kg (137 lb 12 oz), SpO2 100 %.Body mass index is 21.1 kg/(m^2).  General Appearance: Fairly Groomed  Engineer, water::  Fair  Speech:  Clear and Coherent  Volume:  Normal  Mood:  Euthymic  Affect:  Appropriate  Thought Process:  Coherent and Goal Directed  Orientation:  Full (Time, Place, and Person)  Thought Content:  plans as he moves on, relapse prevention plan  Suicidal Thoughts:  No  Homicidal Thoughts:  No  Memory:  Immediate;   Fair Recent;   Fair Remote;   Fair  Judgement:  Fair  Insight:  Present  Psychomotor Activity:  Normal  Concentration:  Fair  Recall:  [employer] of Knowledge:NA  Language: Fair  Akathisia:  No  Handed:    AIMS (if indicated):     Assets:  Desire for Improvement Housing Social Support  Sleep:  Number of Hours: 5.5    Musculoskeletal: Strength & Muscle Tone: within normal limits Gait & Station: normal Patient leans: N/A   Mental Status Per Nursing Assessment::   On Admission:     Current Mental Status by Physician: In full contact with reality. There are no active S/S of withdrawal. There are no active SI plans or intent. He is committed to abstinence. Aware that he needs to continue to follow up with his PCP concerning his BP   Loss Factors: NA  Historical Factors: NA  Risk Reduction Factors:   Sense of responsibility to family and Positive social support  Continued Clinical Symptoms:  Alcohol/Substance Abuse/Dependencies  Cognitive Features That Contribute To Risk: None identified   Suicide Risk:  Minimal: No identifiable suicidal ideation.  Patients presenting with no risk factors but with morbid ruminations;  may be classified as minimal risk based on the severity of the depressive symptoms  Discharge Diagnoses:   AXIS I:  Alcohol Dependence, S/P alcohol wihtdrawal AXIS II:  No diagnosis AXIS III:   Past Medical History  Diagnosis Date  . Hypertension   . ETOH abuse   . Cancer     colon   AXIS IV:  other psychosocial or environmental problems AXIS V:  61-70 mild symptoms  Plan Of Care/Follow-up recommendations:  Activity:  as tolerated Diet:  regular Follow up outpatient basis and PCP Is patient on multiple antipsychotic therapies at discharge:  No   Has Patient had three or more failed trials of antipsychotic monotherapy by history:  No  Recommended Plan for Multiple Antipsychotic Therapies: NA    Verlisa Vara A 01/26/2014, 12:54 PM

## 2014-01-26 NOTE — Progress Notes (Signed)
D: Pt continues to present with htn this evening. Pt was asymptomatic. On-call extender was contacted. A one time order for Labetalol was given. Pt's BP WDL at follow-up. Pt reports that he is feeling good today. Pt discusses his past work ethic and how he always aims [place] over and beyond. Pt is currently denying any SI/HI/AVH. Pt actively participates within the milieu. Pt was appropriate in affect and pleasant in mood.  A: Writer administered scheduled medications to pt, per MD orders. Continued support and availability as needed was extended to this pt. Staff continue to monitor pt with q17min checks.  R: No adverse drug reactions noted. Pt receptive to treatment. Pt remains safe at this time.

## 2014-01-26 NOTE — BHH Group Notes (Signed)
[facility] LCSW Aftercare Discharge Planning Group Note  01/26/2014  8:45 AM  Participation Quality: Did Not Attend- patient in bed   Tilden Fossa, MSW, [place] Worker [facility] [phone]

## 2014-01-27 MED ORDER — ADULT MULTIVITAMIN W/MINERALS CH: 1.0000 | ORAL_TABLET | Freq: Every day | ORAL | Status: DC

## 2014-01-27 MED ORDER — LISINOPRIL-HYDROCHLOROTHIAZIDE 20-25 MG PO TABS: 1.0000 | ORAL_TABLET | Freq: Every day | ORAL | Status: DC

## 2014-01-27 NOTE — Discharge Summary (Signed)
Physician Discharge Summary Note  Patient:  Greg Lewis is an 64 y.o., male MRN:  [id number] DOB:  1950-07-30 Patient phone:  [phone] (home)  Patient address:   [address] [place] [address],  Total Time spent with patient: 45 minutes  Date of Admission:  01/20/2014 Date of Discharge: 01/26/2014  Reason for Admission:  Substance abuse  Discharge Diagnoses: ETOH Dependence  Active Problems:   Alcohol abuse   Essential hypertension   Alcohol dependence with alcohol-induced mood disorder  Psychiatric Specialty Exam: Physical Exam  Vitals reviewed. Psychiatric: He has a normal mood and affect. His speech is normal and behavior is normal. Judgment and thought content normal. Cognition and memory are normal.    Review of Systems  Psychiatric/Behavioral: Positive for substance abuse (CHRONIC, STABILIZED). Negative for depression, suicidal ideas, hallucinations and memory loss. The patient is nervous/anxious. The patient does not have insomnia.     Blood pressure 131/78, pulse 72, temperature 98.4 F (36.9 C), temperature source Oral, resp. rate 24, height 5' 7.75" (1.721 m), weight 62.483 kg (137 lb 12 oz), SpO2 100 %.Body mass index is 21.1 kg/(m^2).                                                    Past Psychiatric History: Diagnosis: Describes long history of alcohol dependence  Hospitalizations: prior detoxifications  Outpatient Care: at this time does not endorse   Substance Abuse Care: at this time does not endorse  Self-Mutilation: Denies  Suicidal Attempts: Denies   Violent Behaviors: Denies    Musculoskeletal: Strength & Muscle Tone: within normal limits Gait & Station: normal Patient leans: N/A  DSM5:  Schizophrenia Disorders:  NA Obsessive-Compulsive Disorders:  NA Trauma-Stressor Disorders:  NA Substance/Addictive Disorders:  Alcohol Related Disorder - Severe (303.90) Depressive Disorders:  NA  Axis Diagnosis:    AXIS I:  Substance Abuse AXIS II:  Deferred AXIS III:   Past Medical History  Diagnosis Date  . Hypertension   . ETOH abuse   . Cancer     colon   AXIS IV:  economic problems, occupational problems and other psychosocial or environmental problems AXIS V:  61-70 mild symptoms  Level of Care:  OP  Hospital Course:  63 year old man, who presented to the hospital requesting detoxification from alcohol. After several months of sobriety, he relapsed about a month ago, and has been drinking approximately 12 beers, and sometimes some liquor in addition, daily. In the context of his relapse, he has lost some weight, has been eating poorly and has been feeling somewhat depressed, emotionally labile. Patient presented with slight distal tremors, tachycardia, but denies diaphoresis, and does not appear to be in any acute distress or discomfort. He states " I think I am over the hump of the withdrawal because I felt worse yesterday" He could not identify any specific stressor that might have triggered his relapse, But does state that he had family stressors, particularly his wife's chronic depression and his adult son's history of substance abuse.  Wane did well during his stay.  At time of discharge, he rated both depression and anxiety levels to be manageable and minimal.  He was able to identify the triggers of his emotional crises and de-stabilizations.  He identified the positive things in his life that would help him deal better with feelings of  loss, depression and etoh dependence.  He did well with the medications prescribed for him.  Denied physiological concerns/SI/HI/AVH at time of discharge.  He has satisfactory support network and home environment and will adhere to medication compliance and outpatient treatment.     Consults:  psychiatry  Significant Diagnostic Studies:  labs: PER ED  Discharge Vitals:   Blood pressure 131/78, pulse 72, temperature 98.4 F (36.9 C), temperature  source Oral, resp. rate 24, height 5' 7.75" (1.721 m), weight 62.483 kg (137 lb 12 oz), SpO2 100 %. Body mass index is 21.1 kg/(m^2). Lab Results:   Results for orders placed or performed during the hospital encounter of 01/20/14 (from the past 72 hour(s))  Pro b natriuretic peptide     Status: None   Collection Time: 01/25/14  7:29 PM  Result Value Ref Range   Pro B Natriuretic peptide (BNP) 51.4 0 - 125 pg/mL    Comment: Performed at [facility]    Physical Findings: AIMS: Facial and Oral Movements Muscles of Facial Expression: None, normal Lips and Perioral Area: None, normal Jaw: None, normal Tongue: None, normal,Extremity Movements Upper (arms, wrists, hands, fingers): None, normal Lower (legs, knees, ankles, toes): None, normal, Trunk Movements Neck, shoulders, hips: None, normal, Overall Severity Severity of abnormal movements (highest score from questions above): None, normal Incapacitation due to abnormal movements: None, normal Patient's awareness of abnormal movements (rate only patient's report): No Awareness, Dental Status Current problems with teeth and/or dentures?: No Does patient usually wear dentures?: No  CIWA:  CIWA-Ar Total: 0 COWS:  COWS Total Score: 6  Psychiatric Specialty Exam: See Psychiatric Specialty Exam and Suicide Risk Assessment completed by Attending Physician prior to discharge.  Discharge destination:  Home  Is patient on multiple antipsychotic therapies at discharge:  No   Has Patient had three or more failed trials of antipsychotic monotherapy by history:  No  Recommended Plan for Multiple Antipsychotic Therapies: NA     Medication List    TAKE these medications      Indication   acamprosate 333 MG tablet  Commonly known as:  CAMPRAL  Take 2 tablets (666 mg total) by mouth 3 (three) times daily with meals.   Indication:  Excessive Use of Alcohol     lisinopril-hydrochlorothiazide 20-25 MG per tablet  Commonly  known as:  PRINZIDE,ZESTORETIC  Take 1 tablet by mouth daily.   Indication:  High Blood Pressure     metoprolol tartrate 25 MG tablet  Commonly known as:  LOPRESSOR  Take 1 tablet (25 mg total) by mouth 2 (two) times daily.   Indication:  Feeling Anxious, High Blood Pressure     multivitamin with minerals Tabs tablet  Take 1 tablet by mouth daily.   Indication:  HEALTH MAINTENANCE     traZODone 50 MG tablet  Commonly known as:  DESYREL  Take 1 tablet (50 mg total) by mouth at bedtime as needed for sleep.   Indication:  Trouble Sleeping, Major Depressive Disorder       Follow-up Information    Follow up with [facility] On 01/27/2014.   Why:  You are scheduled with Nelda Severe on Tuesday, January 27, 2014 at [facility] information:   [address] [place]   [address]  [phone]      Follow-up recommendations:  Activity:  AS TOLERATED Diet:  AS TOLERATED  Comments:  1.  Take all your medications as prescribed.  2.  Report any adverse side effects to outpatient provider.                       3.  Patient instructed to not use alcohol or illegal drugs while on prescription medicines.            4.  In the event of worsening symptoms, instructed patient to call 911, the crisis hotline or go to nearest emergency room for evaluation of symptoms.  Total Discharge Time:  Greater than 30 minutes.  SignedKerrie Buffalo MAY, AGNP-BC 01/27/2014, 10:26 PM

## 2014-01-30 NOTE — Progress Notes (Signed)
Patient Discharge Instructions:  No documentation was faxed to [facility] for [facility]. After multiple calls & messages there was no answer or return calls.  Unable to confirm fax number or address.  Greg Lewis, 01/30/2014, 3:54 PM

## 2016-03-24 ENCOUNTER — Encounter (HOSPITAL_COMMUNITY): Payer: Self-pay | Admitting: Emergency Medicine

## 2016-03-24 ENCOUNTER — Inpatient Hospital Stay (HOSPITAL_COMMUNITY)
Admission: EM | Admit: 2016-03-24 | Discharge: 2016-03-27 | DRG: 641 | Disposition: A | Discharge status: Home / Self Care | Payer: Self-pay | Attending: Internal Medicine | Admitting: Internal Medicine

## 2016-03-24 ENCOUNTER — Emergency Department (HOSPITAL_COMMUNITY): Payer: Self-pay

## 2016-03-24 DIAGNOSIS — R748 Abnormal levels of other serum enzymes: Secondary | ICD-10-CM | POA: Diagnosis present

## 2016-03-24 DIAGNOSIS — Z91018 Allergy to other foods: Secondary | ICD-10-CM

## 2016-03-24 DIAGNOSIS — Y907 Blood alcohol level of 200-239 mg/100 ml: Secondary | ICD-10-CM

## 2016-03-24 DIAGNOSIS — F19239 Other psychoactive substance dependence with withdrawal, unspecified: Secondary | ICD-10-CM | POA: Insufficient documentation

## 2016-03-24 DIAGNOSIS — E871 Hypo-osmolality and hyponatremia: Principal | ICD-10-CM | POA: Diagnosis present

## 2016-03-24 DIAGNOSIS — F17210 Nicotine dependence, cigarettes, uncomplicated: Secondary | ICD-10-CM | POA: Diagnosis present

## 2016-03-24 DIAGNOSIS — F1020 Alcohol dependence, uncomplicated: Secondary | ICD-10-CM | POA: Diagnosis present

## 2016-03-24 DIAGNOSIS — K7010 Alcoholic hepatitis without ascites: Secondary | ICD-10-CM | POA: Diagnosis present

## 2016-03-24 DIAGNOSIS — R569 Unspecified convulsions: Secondary | ICD-10-CM

## 2016-03-24 DIAGNOSIS — F1010 Alcohol abuse, uncomplicated: Secondary | ICD-10-CM

## 2016-03-24 DIAGNOSIS — F10929 Alcohol use, unspecified with intoxication, unspecified: Secondary | ICD-10-CM | POA: Diagnosis present

## 2016-03-24 DIAGNOSIS — T502X5A Adverse effect of carbonic-anhydrase inhibitors, benzothiadiazides and other diuretics, initial encounter: Secondary | ICD-10-CM | POA: Diagnosis present

## 2016-03-24 DIAGNOSIS — I10 Essential (primary) hypertension: Secondary | ICD-10-CM | POA: Diagnosis present

## 2016-03-24 DIAGNOSIS — F19939 Other psychoactive substance use, unspecified with withdrawal, unspecified: Secondary | ICD-10-CM | POA: Insufficient documentation

## 2016-03-24 DIAGNOSIS — F10229 Alcohol dependence with intoxication, unspecified: Secondary | ICD-10-CM | POA: Diagnosis present

## 2016-03-24 LAB — URINALYSIS, ROUTINE W REFLEX MICROSCOPIC
BILIRUBIN URINE: NEGATIVE
GLUCOSE, UA: NEGATIVE mg/dL
HGB URINE DIPSTICK: NEGATIVE
KETONES UR: 5 mg/dL — AB
Leukocytes, UA: NEGATIVE
NITRITE: NEGATIVE
PH: 7 (ref 5.0–8.0)
Protein, ur: NEGATIVE mg/dL
SPECIFIC GRAVITY, URINE: 1.006 (ref 1.005–1.030)

## 2016-03-24 LAB — COMPREHENSIVE METABOLIC PANEL
ALBUMIN: 4.2 g/dL (ref 3.5–5.0)
ALK PHOS: 90 U/L (ref 38–126)
ALT: 103 U/L — ABNORMAL HIGH (ref 17–63)
ANION GAP: 9 (ref 5–15)
AST: 125 U/L — ABNORMAL HIGH (ref 15–41)
BILIRUBIN TOTAL: 0.8 mg/dL (ref 0.3–1.2)
BUN: 7 mg/dL (ref 6–20)
CALCIUM: 8.4 mg/dL — AB (ref 8.9–10.3)
CO2: 28 mmol/L (ref 22–32)
Chloride: 87 mmol/L — ABNORMAL LOW (ref 101–111)
Creatinine, Ser: 0.72 mg/dL (ref 0.61–1.24)
GFR calc non Af Amer: >60 mL/min (ref >60)
GLUCOSE: 132 mg/dL — AB (ref 65–99)
POTASSIUM: 3.6 mmol/L (ref 3.5–5.1)
Sodium: 124 mmol/L — ABNORMAL LOW (ref 135–145)
TOTAL PROTEIN: 7.6 g/dL (ref 6.5–8.1)

## 2016-03-24 LAB — RAPID URINE DRUG SCREEN, HOSP PERFORMED
AMPHETAMINES: NOT DETECTED
BENZODIAZEPINES: NOT DETECTED
Barbiturates: NOT DETECTED
COCAINE: NOT DETECTED
OPIATES: NOT DETECTED
Tetrahydrocannabinol: NOT DETECTED

## 2016-03-24 LAB — CBC
HCT: 45 % (ref 39.0–52.0)
HEMOGLOBIN: 16.4 g/dL (ref 13.0–17.0)
MCH: 34 pg (ref 26.0–34.0)
MCHC: 36.4 g/dL — ABNORMAL HIGH (ref 30.0–36.0)
MCV: 93.2 fL (ref 78.0–100.0)
PLATELETS: 163 10*3/uL (ref 150–400)
RBC: 4.83 MIL/uL (ref 4.22–5.81)
RDW: 12.3 % (ref 11.5–15.5)
WBC: 4.7 10*3/uL (ref 4.0–10.5)

## 2016-03-24 LAB — ETHANOL: ALCOHOL ETHYL (B): 202 mg/dL — AB (ref <5)

## 2016-03-24 MED ORDER — THIAMINE HCL 100 MG/ML IJ SOLN
Freq: Once | INTRAVENOUS | Status: AC
  Administered 2016-03-24: via INTRAVENOUS
  Filled 2016-03-24: qty 1000

## 2016-03-24 MED ORDER — HYDRALAZINE HCL 20 MG/ML IJ SOLN: 10.0000 mg | INTRAMUSCULAR | Status: DC | PRN

## 2016-03-24 MED ORDER — ADULT MULTIVITAMIN W/MINERALS CH
1.0000 | ORAL_TABLET | Freq: Every day | ORAL | Status: DC
  Administered 2016-03-25 – 2016-03-27 (×3): 1 via ORAL
  Filled 2016-03-24 (×3): qty 1

## 2016-03-24 MED ORDER — ACETAMINOPHEN 650 MG RE SUPP
650.0000 mg | Freq: Four times a day (QID) | RECTAL | Status: DC | PRN
Start: 2016-03-24 — End: 2016-03-27

## 2016-03-24 MED ORDER — ENSURE ENLIVE PO LIQD
237.0000 mL | Freq: Two times a day (BID) | ORAL | Status: DC
  Administered 2016-03-25 – 2016-03-27 (×4): 237 mL via ORAL

## 2016-03-24 MED ORDER — POLYETHYLENE GLYCOL 3350 17 G PO PACK: 17.0000 g | PACK | Freq: Every day | ORAL | Status: DC | PRN

## 2016-03-24 MED ORDER — NICOTINE 21 MG/24HR TD PT24
21.0000 mg | MEDICATED_PATCH | Freq: Every day | TRANSDERMAL | Status: DC
  Administered 2016-03-24 – 2016-03-27 (×4): 21 mg via TRANSDERMAL
  Filled 2016-03-24 (×4): qty 1

## 2016-03-24 MED ORDER — ONDANSETRON HCL 4 MG PO TABS: 4.0000 mg | ORAL_TABLET | Freq: Four times a day (QID) | ORAL | Status: DC | PRN

## 2016-03-24 MED ORDER — FOLIC ACID 1 MG PO TABS
1.0000 mg | ORAL_TABLET | Freq: Every day | ORAL | Status: DC
  Administered 2016-03-25 – 2016-03-27 (×3): 1 mg via ORAL
  Filled 2016-03-24 (×3): qty 1

## 2016-03-24 MED ORDER — NICOTINE POLACRILEX 2 MG MT GUM
2.0000 mg | CHEWING_GUM | OROMUCOSAL | Status: DC | PRN
Start: 2016-03-24 — End: 2016-03-27

## 2016-03-24 MED ORDER — VITAMIN B-1 100 MG PO TABS
100.0000 mg | ORAL_TABLET | Freq: Every day | ORAL | Status: DC
  Administered 2016-03-25 – 2016-03-27 (×3): 100 mg via ORAL
  Filled 2016-03-24 (×3): qty 1

## 2016-03-24 MED ORDER — LORAZEPAM 2 MG/ML IJ SOLN
1.0000 mg | Freq: Four times a day (QID) | INTRAMUSCULAR | Status: DC | PRN
  Administered 2016-03-25: 1 mg via INTRAVENOUS
  Filled 2016-03-24: qty 1

## 2016-03-24 MED ORDER — ENOXAPARIN SODIUM 40 MG/0.4ML ~~LOC~~ SOLN
40.0000 mg | SUBCUTANEOUS | Status: DC
  Administered 2016-03-24 – 2016-03-26 (×3): 40 mg via SUBCUTANEOUS
  Filled 2016-03-24 (×3): qty 0.4

## 2016-03-24 MED ORDER — SODIUM CHLORIDE 0.9 % IV BOLUS (SEPSIS)
500.0000 mL | Freq: Once | INTRAVENOUS | Status: AC
  Administered 2016-03-24: 500 mL via INTRAVENOUS

## 2016-03-24 MED ORDER — THIAMINE HCL 100 MG/ML IJ SOLN
INTRAMUSCULAR | Status: AC
  Filled 2016-03-24: qty 2

## 2016-03-24 MED ORDER — LORAZEPAM 1 MG PO TABS
1.0000 mg | ORAL_TABLET | Freq: Four times a day (QID) | ORAL | Status: DC | PRN
  Administered 2016-03-26: 1 mg via ORAL
  Filled 2016-03-24: qty 1

## 2016-03-24 MED ORDER — FOLIC ACID 5 MG/ML IJ SOLN
INTRAMUSCULAR | Status: AC
  Filled 2016-03-24: qty 0.2

## 2016-03-24 MED ORDER — ONDANSETRON HCL 4 MG/2ML IJ SOLN: 4.0000 mg | Freq: Four times a day (QID) | INTRAMUSCULAR | Status: DC | PRN

## 2016-03-24 MED ORDER — ACETAMINOPHEN 325 MG PO TABS: 650.0000 mg | ORAL_TABLET | Freq: Four times a day (QID) | ORAL | Status: DC | PRN

## 2016-03-24 MED ORDER — LISINOPRIL 10 MG PO TABS
20.0000 mg | ORAL_TABLET | Freq: Every day | ORAL | Status: DC
  Administered 2016-03-25 – 2016-03-27 (×3): 20 mg via ORAL
  Filled 2016-03-24 (×3): qty 2

## 2016-03-24 MED ORDER — M.V.I. ADULT IV INJ
INJECTION | INTRAVENOUS | Status: AC
  Filled 2016-03-24: qty 10

## 2016-03-24 MED ORDER — THIAMINE HCL 100 MG/ML IJ SOLN: 100.0000 mg | Freq: Every day | INTRAMUSCULAR | Status: DC

## 2016-03-24 MED ORDER — LORAZEPAM 1 MG PO TABS
0.0000 mg | ORAL_TABLET | Freq: Two times a day (BID) | ORAL | Status: DC
Start: 2016-03-27 — End: 2016-03-27

## 2016-03-24 MED ORDER — LORAZEPAM 1 MG PO TABS
0.0000 mg | ORAL_TABLET | Freq: Four times a day (QID) | ORAL | Status: AC
  Administered 2016-03-25 – 2016-03-26 (×5): 1 mg via ORAL
  Filled 2016-03-24 (×4): qty 1

## 2016-03-24 NOTE — ED Triage Notes (Signed)
Pt wants detox from alcohol, drinks beer all day states, has been out of work so drinking more and not eating, Wife states it has been 3 year since last rehab.  Pt states he drank one hour. Only had 2 today.

## 2016-03-24 NOTE — ED Notes (Signed)
Went  To check vital Dr came in to talk to pt I left

## 2016-03-24 NOTE — H&P (Signed)
History and Physical    Greg Lewis [id number] DOB: Nov 19, 1950 DOA: 03/24/2016  PCP: No PCP Per Patient   Patient coming from: Home  Chief Complaint: Alcoholism, desiring detox   HPI: Greg Lewis is a 65 y.o. male with medical history significant for hypertension, alcohol abuse, and remote alcohol withdrawal seizure who presents to the emergency department to request help with achieving abstinence from alcohol. Patient has a long history of heavy drinking and has history of alcohol withdrawal seizure in 2004 or 2005. He [facility] for detox from alcohol, but relapsed shortly after and has been drinking daily since. 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 He is now recently unemployed and has reportedly been drinking throughout the day. The last time he attempted to quit on his own is when he suffered the withdrawal seizure. He denies any recent fevers or chills, chest pain or palpitations, dyspnea or cough. He denies any significant nausea and there is been no vomiting or diarrhea.  ED Course: Upon arrival to the ED, patient is found to be afebrile, saturating well on room air, mildly hypertensive, but with vitals otherwise stable. Chemistry panel is notable for a serum sodium of 124, chloride of 87, AST 125, and ALT 103. Ethanol level is elevated to 202. CBC is unremarkable, UDS is negative, and urinalysis is notable only for a few ketones. Patient was given a 500 mL normal saline bolus in the emergency department. He has remained hemodynamically stable and in no respiratory distress. He will be observed on the medical/surgical unit for ongoing evaluation and management of hyponatremia suspected secondary to beer drinkers potomania and HCTZ use, as well as alcohol dependency with history of cocaine  withdrawal.  Review of Systems:  All other systems reviewed and apart from HPI, are negative.  Past Medical History:  Diagnosis Date  . Cancer ([place])    colon  . ETOH abuse   . Hypertension     Past Surgical History:  Procedure Laterality Date  . COLONOSCOPY W/ BIOPSIES AND POLYPECTOMY       reports that he has been smoking Cigarettes.  He has been smoking about 1.00 pack per day. He has never used smokeless tobacco. He reports that he drinks about 50.4 oz of alcohol per week . He reports that he does not use drugs.  Allergies  Allergen Reactions  . Cabbage Other (See Comments)    Said it was bout lethal    History reviewed. No pertinent family history.   Prior to Admission medications   Medication Sig Start Date End Date Taking? Authorizing Provider  ibuprofen (ADVIL,MOTRIN) 200 MG tablet Take 200 mg by mouth daily as needed for mild pain or moderate pain.   Yes Historical Provider, MD  lisinopril-hydrochlorothiazide (PRINZIDE,ZESTORETIC) 20-25 MG per tablet Take 1 tablet by mouth daily. 01/27/14  Yes Kerrie Buffalo, NP    Physical Exam: Vitals:   03/24/16 2000 03/24/16 2044 03/24/16 2045 03/24/16 2100  BP: 149/86 162/89 162/89 155/83  Pulse: 88 86 82 79  Resp: 16 16 18 16   Temp:      TempSrc:      SpO2: 98% 98% 98% 98%  Weight:      Height:          Constitutional: NAD, calm, comfortable, very thin.  Eyes: PERTLA, lids and conjunctivae normal ENMT:  Mucous membranes are moist. Posterior pharynx clear of any exudate or lesions.   Neck: normal, supple, no masses, no thyromegaly Respiratory: Mildly diminished bilaterally with occasional end-expiratory wheeze. Normal respiratory effort. No accessory muscle use.  Cardiovascular: S1 & S2 heard, regular rate and rhythm. No extremity edema. No significant JVD. Abdomen: No distension, no tenderness, no masses palpated. Bowel sounds normal.  Musculoskeletal: no clubbing / cyanosis. No joint deformity upper and lower  extremities. Normal muscle tone.  Skin: no significant rashes, lesions, ulcers. Warm, dry, well-perfused. Xerosis. Neurologic: CN 2-12 grossly intact. Sensation intact, DTR normal. Strength 5/5 in all 4 limbs.  Psychiatric: Normal judgment and insight. Alert and oriented x 3. Normal mood and affect.     Labs on Admission: I have personally reviewed following labs and imaging studies  CBC:  Recent Labs Lab 03/24/16 1714  WBC 4.7  HGB 16.4  HCT 45.0  MCV 93.2  PLT [id number]   Basic Metabolic Panel:  Recent Labs Lab 03/24/16 1714  NA 124*  K 3.6  CL 87*  CO2 28  GLUCOSE 132*  BUN 7  CREATININE 0.72  CALCIUM 8.4*   GFR: Estimated Creatinine Clearance: 78.6 mL/min (by C-G formula based on SCr of 0.72 mg/dL). Liver Function Tests:  Recent Labs Lab 03/24/16 1714  AST 125*  ALT 103*  ALKPHOS 90  BILITOT 0.8  PROT 7.6  ALBUMIN 4.2   No results for input(s): LIPASE, AMYLASE in the last 168 hours. No results for input(s): AMMONIA in the last 168 hours. Coagulation Profile: No results for input(s): INR, PROTIME in the last 168 hours. Cardiac Enzymes: No results for input(s): CKTOTAL, CKMB, CKMBINDEX, TROPONINI in the last 168 hours. BNP (last 3 results) No results for input(s): PROBNP in the last 8760 hours. HbA1C: No results for input(s): HGBA1C in the last 72 hours. CBG: No results for input(s): GLUCAP in the last 168 hours. Lipid Profile: No results for input(s): CHOL, HDL, LDLCALC, TRIG, CHOLHDL, LDLDIRECT in the last 72 hours. Thyroid Function Tests: No results for input(s): TSH, T4TOTAL, FREET4, T3FREE, THYROIDAB in the last 72 hours. Anemia Panel: No results for input(s): VITAMINB12, FOLATE, FERRITIN, TIBC, IRON, RETICCTPCT in the last 72 hours. Urine analysis:    Component Value Date/Time   COLORURINE YELLOW 03/24/2016 2030   [place] 03/24/2016 2030   LABSPEC 1.006 03/24/2016 2030   PHURINE 7.0 03/24/2016 2030   GLUCOSEU NEGATIVE 03/24/2016  2030   [place] NEGATIVE 03/24/2016 2030   [place] NEGATIVE 03/24/2016 2030   KETONESUR 5 (A) 03/24/2016 2030   PROTEINUR NEGATIVE 03/24/2016 2030   NITRITE NEGATIVE 03/24/2016 2030   LEUKOCYTESUR NEGATIVE 03/24/2016 2030   Sepsis Labs: @LABRCNTIP (procalcitonin:4,lacticidven:4) )No results found for this or any previous visit (from the past 240 hour(s)).   Radiological Exams on Admission: Dg Chest 2 View  Result Date: 03/24/2016 CLINICAL DATA:  Acute onset of cough.  Initial encounter. EXAM: CHEST  2 VIEW COMPARISON:  None. FINDINGS: The lungs are well-aerated and clear. There is no evidence of focal opacification, pleural effusion or pneumothorax. The heart is normal in size; the mediastinal contour is within normal limits. No acute osseous abnormalities are seen. IMPRESSION: No acute cardiopulmonary process seen. Electronically Signed   By: Garald Balding M.D.   On: 03/24/2016 21:14    EKG: Not performed, will obtain as appropriate.   Assessment/Plan  1. Hyponatremia  - Serum sodium 124 on admission; was 141 in 2015 with no more recent values available  - Likely multifactorial with contributions  from beer drinker's potomania, and likely from HCTZ use  - HCTZ is held on admission and alcohol abuse addressed below  - He was given a 500 cc NS bolus in ED and will receive banana bag overnight  - Plan to follow serial chem panels q8h initially to avoid over-rapid correction   2. Alcohol dependence, acute intoxication, hx withdrawal seizure  - Pt has long-history of heavy alcohol use and withdrawal seizure in 2004 or 2005 - He was admitted to [facility] in November 2015 for detox, but reports relapsing shortly after  - He denies depression, anxiety, or insomnia as reasons for drinking, but thinks it has just been his way of life that he is accustomed to  - He reports previous consumption of six 16 oz beers every night after work, but has been unemployed recently and now drinking all  throughout the day  - He presents requesting help with achieving abstinence  - Given his age, degree of dependency, and hx of withdrawal seizure, he will likely need to be inpatient for detox; social work consultation is requested with hope that admission to inpatient detox unit might be possible once hyponatremia is stabilized  - He will be monitored with CIWA and prn Ativan here   3. 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 Hypertension  - Mildly elevated in ED - Managed with lisinopril-HCTZ at home - HCTZ component held given hyponatremia, but lisinopril will be continued as tolerated   5. Current smoker - Counseled toward cessation - Nicotine replacement provided  - RN asked to provide smoking-cessation information prior to discharge   DVT prophylaxis: sq Lovenox Code Status: Full  Family Communication: Wife updated at bedside at patient's request Disposition Plan: Observe on med-surg Consults called: None Admission status: Observation    Vianne Bulls, MD [facility] Pager [phone]  If 7PM-7AM, please contact night-coverage [email or web address] Password [email or web address]  03/24/2016, 9:50 PM

## 2016-03-24 NOTE — ED Notes (Signed)
Pt ambulatory to restroom with steady and even gait.

## 2016-03-24 NOTE — ED Notes (Signed)
Pt reports he drank a 16 oz beer, wife states 2 16 oz beers today. Usually drinks " a six pack". Denies auditory or visual hallucinations, HA, N/V/D, no tremors seen. Hx of ETOH abuse and rehab.

## 2016-03-24 NOTE — ED Provider Notes (Signed)
[facility] DEPT Provider Note   CSN: [id number] Arrival date & time: 03/24/16  1622     History   Chief Complaint Chief Complaint  Patient presents with  . Alcohol Problem    HPI Greg Lewis is a 65 y.o. male.  HPI Patient presents with his wife for alcoholism. States is chronic for several years. Admits to drinking a six-pack of beer daily. Last drank earlier today. States he's had 2 beers. Denies any withdrawal symptoms. Denies nausea or vomiting. No other coingestants. Patient is a daily pack/day smoker. Has ongoing cough but denies chest pain or shortness of breath. No fevers or chills. Past Medical History:  Diagnosis Date  . Cancer ([place])    colon  . ETOH abuse   . Hypertension     Patient Active Problem List   Diagnosis Date Noted  . Hyponatremia 03/24/2016  . Alcohol intoxication ([place]) 03/24/2016  . 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 Essential hypertension 01/25/2014  . Alcohol abuse 01/20/2014  . Alcohol dependence ([place]) 01/18/2012  . Elevated liver enzymes 01/18/2012    Past Surgical History:  Procedure Laterality Date  . COLONOSCOPY W/ BIOPSIES AND POLYPECTOMY         Home Medications    Prior to Admission medications   Medication Sig Start Date End Date Taking? Authorizing Provider  ibuprofen (ADVIL,MOTRIN) 200 MG tablet Take 200 mg by mouth daily as needed for mild pain or moderate pain.   Yes Historical Provider, MD  lisinopril-hydrochlorothiazide (PRINZIDE,ZESTORETIC) 20-25 MG per tablet Take 1 tablet by mouth daily. 01/27/14  Yes Kerrie Buffalo, NP    Family History History reviewed. No pertinent family history.  Social History Social History  Substance Use Topics  . Smoking status: Current Every Day Smoker    Packs/day: 1.00    Types: Cigarettes  . Smokeless tobacco: Never Used  . Alcohol use 50.4 oz/week    84 Cans of beer per week     Comment: 12pk of beer; daily      Allergies    Cabbage   Review of Systems Review of Systems  Constitutional: Negative for chills, fatigue and fever.  HENT: Negative for congestion and sore throat.   Respiratory: Positive for cough. Negative for chest tightness and shortness of breath.   Cardiovascular: Negative for chest pain, palpitations and leg swelling.  Gastrointestinal: Negative for abdominal pain, diarrhea, nausea and vomiting.  Genitourinary: Negative for flank pain, frequency and hematuria.  Musculoskeletal: Positive for back pain. Negative for myalgias, neck pain and neck stiffness.  Skin: Negative for rash and wound.  Neurological: Negative for dizziness, tremors, seizures, syncope, weakness, light-headedness, numbness and headaches.  Psychiatric/Behavioral: Negative for agitation, confusion and hallucinations. The patient is not nervous/anxious.   All other systems reviewed and are negative.    Physical Exam Updated Vital Signs BP 162/89 (BP Location: Left Arm)   Pulse 86   Temp 98.4 F (36.9 C) (Oral)   Resp 16   Ht 5\' 9"  (1.753 m)   Wt 136 lb 11.2 oz (62 kg)   SpO2 98%   BMI 20.19 kg/m   Physical Exam  Constitutional: He is oriented to person, place, and time. He appears well-developed and well-nourished. No distress.  HENT:  Head: Normocephalic and atraumatic.  Mouth/Throat: Oropharynx is clear and moist.  Mildly erythematous oropharynx  Eyes: EOM are normal. Pupils are equal, round, and reactive to light.  Neck: Normal range of motion. Neck supple. No JVD present.  Cardiovascular: Normal rate,  regular rhythm, normal heart sounds and intact distal pulses.  Exam reveals no gallop and no friction rub.   No murmur heard. Pulmonary/Chest: Effort normal. No respiratory distress. He has no wheezes. He has rales.  Few rales in the left base.  Abdominal: Soft. Bowel sounds are normal. There is no tenderness. There is no rebound and no guarding.  Musculoskeletal: Normal range of motion. He exhibits no edema  or tenderness.  No lower extremity swelling or asymmetry. No midline thoracic or lumbar tenderness. No CVA tenderness.  Lymphadenopathy:    He has no cervical adenopathy.  Neurological: He is alert and oriented to person, place, and time.  Moves all extremities without deficit. Sensation intact.  Skin: Skin is warm and dry. Capillary refill takes less than 2 seconds. No rash noted. No erythema.  Psychiatric: He has a normal mood and affect. His behavior is normal.  Nursing note and vitals reviewed.    ED Treatments / Results  Labs (all labs ordered are listed, but only abnormal results are displayed) Labs Reviewed  COMPREHENSIVE METABOLIC PANEL - Abnormal; Notable for the following:       Result Value   Sodium 124 (*)    Chloride 87 (*)    Glucose, Bld 132 (*)    Calcium 8.4 (*)    AST 125 (*)    ALT 103 (*)    All other components within normal limits  ETHANOL - Abnormal; Notable for the following:    Alcohol, Ethyl (B) 202 (*)    All other components within normal limits  CBC - Abnormal; Notable for the following:    MCHC 36.4 (*)    All other components within normal limits  URINALYSIS, ROUTINE W REFLEX MICROSCOPIC - Abnormal; Notable for the following:    Ketones, ur 5 (*)    All other components within normal limits  RAPID URINE DRUG SCREEN, HOSP PERFORMED    EKG  EKG Interpretation None       Radiology Dg Chest 2 View  Result Date: 03/24/2016 CLINICAL DATA:  Acute onset of cough.  Initial encounter. EXAM: CHEST  2 VIEW COMPARISON:  None. FINDINGS: The lungs are well-aerated and clear. There is no evidence of focal opacification, pleural effusion or pneumothorax. The heart is normal in size; the mediastinal contour is within normal limits. No acute osseous abnormalities are seen. IMPRESSION: No acute cardiopulmonary process seen. Electronically Signed   By: Garald Balding M.D.   On: 03/24/2016 21:14    Procedures Procedures (including critical care  time)  Medications Ordered in ED Medications  sodium chloride 0.9 % bolus 500 mL (0 mLs Intravenous Stopped 03/24/16 2130)     Initial Impression / Assessment and Plan / ED Course  I have reviewed the triage vital signs and the nursing notes.  Pertinent labs & imaging results that were available during my care of the patient were reviewed by me and considered in my medical decision making (see chart for details).  Clinical Course    Hyponatremia is likely due to the excessive light beer consumption. Currently is asymptomatic. Discussed with hospitalist and will admit for observation.   Final Clinical Impressions(s) / ED Diagnoses   Final diagnoses:  Chronic alcohol abuse  Hyponatremia    New Prescriptions New Prescriptions   No medications on file     Julianne Rice, MD 03/24/16 2134

## 2016-03-25 DIAGNOSIS — R748 Abnormal levels of other serum enzymes: Secondary | ICD-10-CM

## 2016-03-25 DIAGNOSIS — E871 Hypo-osmolality and hyponatremia: Principal | ICD-10-CM

## 2016-03-25 DIAGNOSIS — I10 Essential (primary) hypertension: Secondary | ICD-10-CM

## 2016-03-25 DIAGNOSIS — F10229 Alcohol dependence with intoxication, unspecified: Secondary | ICD-10-CM

## 2016-03-25 DIAGNOSIS — F10929 Alcohol use, unspecified with intoxication, unspecified: Secondary | ICD-10-CM

## 2016-03-25 LAB — BASIC METABOLIC PANEL
Anion gap: 7 (ref 5–15)
Anion gap: 8 (ref 5–15)
BUN: 8 mg/dL (ref 6–20)
BUN: 11 mg/dL (ref 6–20)
CALCIUM: 8.3 mg/dL — AB (ref 8.9–10.3)
CHLORIDE: 95 mmol/L — AB (ref 101–111)
CO2: 26 mmol/L (ref 22–32)
CO2: 27 mmol/L (ref 22–32)
CREATININE: 0.79 mg/dL (ref 0.61–1.24)
Calcium: 8 mg/dL — ABNORMAL LOW (ref 8.9–10.3)
Chloride: 95 mmol/L — ABNORMAL LOW (ref 101–111)
Creatinine, Ser: 0.63 mg/dL (ref 0.61–1.24)
GFR calc Af Amer: >60 mL/min (ref >60)
GFR calc non Af Amer: >60 mL/min (ref >60)
Glucose, Bld: 75 mg/dL (ref 65–99)
Glucose, Bld: 136 mg/dL — ABNORMAL HIGH (ref 65–99)
Potassium: 3.7 mmol/L (ref 3.5–5.1)
Potassium: 3.5 mmol/L (ref 3.5–5.1)
SODIUM: 130 mmol/L — AB (ref 135–145)
Sodium: 128 mmol/L — ABNORMAL LOW (ref 135–145)

## 2016-03-25 LAB — OSMOLALITY: OSMOLALITY: 315 mosm/kg — AB (ref 275–295)

## 2016-03-25 MED ORDER — SODIUM CHLORIDE 0.9 % IV SOLN: INTRAVENOUS | Status: DC

## 2016-03-25 NOTE — BH Assessment (Addendum)
Tele Assessment Note   Greg Lewis is an 65 y.o. male.   who is currently inpatient on the medical unit as a result of chronic alcohol use and history of severe withdrawals.  Patient denies current SI/HI, A/VH, and other self-injurious behaviors.  Patient reports he started drinking alcohol at age 57 and noticed it being a problem for his life at age 79. Patient reports drinking 6 or more cans of beer daily last drinking yesterday 2 or more beers.   Patient's wife was in the room during this interview with permission from the patient.  Patient has some difficulties hearing therefore multiple questions were asked multiple times for clarity.  He reports he went to treatment 6 times between  2001-2015. 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  Patient did not express any long history of sobriety.  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   When the patient was asked about his willingness to comply with treatment recommendation if inpatient was suggested he became hesitant.  The patient's wife became upset when the patient refused to go into a facility for treatment. She threatened to leave the hospital and the patient began to make attempts to take out his IV so he could leave.  The nurse was called and arrived to the room for assistance.    Patient at that time refused referral and resources for substance abuse treatment.    Diagnosis: Alcohol use, severe  Past Medical History:  Past Medical History:  Diagnosis Date  . Cancer ([place])    colon  . ETOH abuse   . Hypertension     Past Surgical History:  Procedure Laterality Date  . COLONOSCOPY W/ BIOPSIES AND POLYPECTOMY      Family History: History reviewed. No pertinent family history.  Social History:  reports that he has been smoking Cigarettes.  He has been smoking about 1.00 pack per day.  He has never used smokeless tobacco. He reports that he drinks about 50.4 oz of alcohol per week . He reports that he does not use drugs.  Additional Social History:  Alcohol / Drug Use Pain Medications: see chart Prescriptions: see chart Over the Counter: see chart History of alcohol / drug use?: Yes Longest period of sobriety (when/how long): none Negative Consequences of Use: Financial, Legal, Personal relationships, Work / School Withdrawal Symptoms: Delirium, Weakness, Diarrhea, Seizures Onset of Seizures: withdrawals, 5-7 years ago Date of most recent seizure: 5-7 years ago Substance #1 Name of Substance 1: Alcohol 1 - Age of First Use: 36 1 - Amount (size/oz): 6 or more beers 1 - Frequency: daily 1 - Duration: ongoing 1 - Last Use / Amount: yesterday 2 or more cans beers  CIWA: CIWA-Ar BP: (!) 155/97 Pulse Rate: 91 Nausea and Vomiting: no nausea and no vomiting Tactile Disturbances: none Tremor: no tremor Auditory Disturbances: not present Paroxysmal Sweats: no sweat visible Visual Disturbances: not present Anxiety: no anxiety, at ease Headache, Fullness in Head: none present Agitation: normal activity Orientation and Clouding of Sensorium: oriented and can do serial additions CIWA-Ar Total: 0 COWS:    PATIENT STRENGTHS: (choose at least two) Average or above average intelligence Communication skills Supportive family/friends  Allergies:  Allergies  Allergen Reactions  . [place] Other (See Comments)    Said it was bout lethal    Home Medications:  Medications Prior  to Admission  Medication Sig Dispense Refill  . ibuprofen (ADVIL,MOTRIN) 200 MG tablet Take 200 mg by mouth daily as needed for mild pain or moderate pain.    Marland Kitchen lisinopril-hydrochlorothiazide (PRINZIDE,ZESTORETIC) 20-25 MG per tablet Take 1 tablet by mouth daily.      OB/GYN Status:  No LMP for male patient.  General Assessment Data Location of Assessment: [facility] TTS Assessment: In  system Is this a Tele or Face-to-Face Assessment?: Tele Assessment Is this an Initial Assessment or a Re-assessment for this encounter?: Initial Assessment Marital status: Married [place] name: na  Is patient pregnant?: No Pregnancy Status: No Living Arrangements: Spouse/significant other Can pt return to current living arrangement?: Yes Admission Status: Voluntary Is patient capable of signing voluntary admission?: No Referral Source: Self/Family/Friend Insurance type: MCD  Medical Screening Exam ([place]) Medical Exam completed: No  Crisis Care Plan Living Arrangements: Spouse/significant other Name of Psychiatrist: none reported Name of Therapist: none reported  Education Status Is patient currently in school?: No Highest grade of school patient has completed:  (UT[employer])  Risk to self with the past 6 months Suicidal Ideation: No-Not Currently/Within Last 6 Months Has patient been a risk to self within the past 6 months prior to admission? : No Suicidal Intent: No-Not Currently/Within Last 6 Months Has patient had any suicidal intent within the past 6 months prior to admission? : No Is patient at risk for suicide?: No Suicidal Plan?: No-Not Currently/Within Last 6 Months Has patient had any suicidal plan within the past 6 months prior to admission? : No Access to Means: No What has been your use of drugs/alcohol within the last 12 months?: alcohol Previous Attempts/Gestures: No How many times?: 0 Other Self Harm Risks: none reported Intentional Self Injurious Behavior: None Family Suicide History: Unknown Recent stressful life event(s): Conflict (Comment), Loss (Comment), Financial Problems (Alcoholism) Persecutory voices/beliefs?: No Depression: No Substance abuse history and/or treatment for substance abuse?: Yes (alcoholism)  Risk to Others within the past 6 months Homicidal Ideation: No-Not Currently/Within Last 6 Months Does patient have any lifetime risk of  violence toward others beyond the six months prior to admission? : No Thoughts of Harm to Others: No-Not Currently Present/Within Last 6 Months Current Homicidal Intent: No-Not Currently/Within Last 6 Months Current Homicidal Plan: No-Not Currently/Within Last 6 Months Access to Homicidal Means: No Identified Victim: na History of harm to others?: No Assessment of Violence: None Noted Violent Behavior Description: na Does patient have access to weapons?: No Criminal Charges Pending?: No Does patient have a court date: No Is patient on probation?: No  Psychosis Hallucinations:  (only during withdrawals) Delusions:  (only during withdrawal)  Mental Status Report Appearance/Hygiene: In hospital gown Eye Contact: Fair Motor Activity: Freedom of movement Speech: Loud Level of Consciousness: Alert Mood: Irritable, Anxious Affect: Irritable Anxiety Level: Moderate Thought Processes: Relevant Judgement: Partial Orientation: Person, Place, Time Obsessive Compulsive Thoughts/Behaviors: None  Cognitive Functioning Concentration: Fair Memory: Recent Intact, Remote Intact IQ: Average Insight: Poor Impulse Control: Fair Appetite: Fair Weight Loss: 0 Weight Gain: 0 Sleep: No Change Total Hours of Sleep: 5 Vegetative Symptoms: None  ADLScreening [facility] Assessment Services) Patient's cognitive ability adequate to safely complete daily activities?: Yes Patient able to express need for assistance with ADLs?: Yes Independently performs ADLs?: Yes (appropriate for developmental age)  Prior Inpatient Therapy Prior Inpatient Therapy: Yes Prior Therapy Dates: 2001-2015 Prior Therapy Facilty/Provider(s): [facility], [facility], [place],  Reason for Treatment: alcohol  Prior Outpatient Therapy Prior Outpatient Therapy: Yes Prior Therapy  Dates: 2015 Prior Therapy Facilty/Provider(s): unknown Reason for Treatment: alcohol Does patient have an ACCT team?: No Does patient have Intensive In-House  Services?  : No Does patient have [facility] services? : No Does patient have P4CC services?: No  ADL Screening (condition at time of admission) Patient's cognitive ability adequate to safely complete daily activities?: Yes Is the patient deaf or have difficulty hearing?: Yes Does the patient have difficulty seeing, even when wearing glasses/contacts?: No Does the patient have difficulty concentrating, remembering, or making decisions?: Yes Patient able to express need for assistance with ADLs?: Yes Does the patient have difficulty dressing or bathing?: No Independently performs ADLs?: Yes (appropriate for developmental age) Does the patient have difficulty walking or climbing stairs?: No Weakness of Legs: None Weakness of Arms/Hands: None  Home Assistive Devices/Equipment Home Assistive Devices/Equipment: Eyeglasses, Dentures (specify type)  Therapy Consults (therapy consults require a physician order) PT Evaluation Needed: No OT Evalulation Needed: No SLP Evaluation Needed: No Abuse/Neglect Assessment (Assessment to be complete while patient is alone) Physical Abuse: Denies Verbal Abuse: Denies Sexual Abuse: Denies Exploitation of patient/patient's resources: Denies Self-Neglect: Denies Values / Beliefs Cultural Requests During Hospitalization: None Spiritual Requests During Hospitalization: None Consults Spiritual Care Consult Needed: No Social Work Consult Needed: No Regulatory affairs officer (For Healthcare) Does Patient Have a Medical Advance Directive?: No Would patient like information on creating a medical advance directive?: Yes (MAU/Ambulatory/Procedural Areas - Information given) Nutrition Screen- [facility] Adult/[facility] Patient's home diet: Regular Has the patient recently lost weight without trying?: Yes, 2-13 lbs. Has the patient been eating poorly because of a decreased appetite?: Yes Malnutrition Screening Tool Score: 2  Additional Information 1:1 In Past 12 Months?:  No CIRT Risk: No Elopement Risk: No Does patient have medical clearance?: No     Disposition: Patient refused referral recommendation and discontinued interview Disposition Initial Assessment Completed for this Encounter: Yes Disposition of Patient: Other dispositions (Pt. refused ) Other disposition(s): Other (Comment) (Pt refused)  Chesley Noon A 03/25/2016 6:00 PM

## 2016-03-25 NOTE — Progress Notes (Signed)
PROGRESS NOTE    Greg Lewis  [id number] DOB: Jan 14, 1951 DOA: 03/24/2016 PCP: No PCP Per Patient    Brief Narrative:  65 year old male with a history of alcoholism, hypertension who presented to the hospital for alcoholism and desiring detox. Workup in the emergency room revealed hyponatremia with a sodium of 124. Remainder of his workup was otherwise negative. He was referred for admission.   Assessment & Plan:   Principal Problem:   Hyponatremia Active Problems:   Alcohol dependence (H[facility])   Elevated liver enzymes   Essential hypertension   Alcohol intoxication ([place])   1. Hyponatremia. Sodium on admission was 124. Likely related to HCTZ use as well as beer drinkers potomania. Started on saline solution with improvement of sodium. Continue saline overnight.  2. Alcohol dependence. Patient has long history of alcohol was on and has been admitted to behavioral health Hospital in the past for detox. He is currently on CIWA protocol with Ativan for withdrawal. He does have a history of withdrawal seizure. We'll request psychiatry consult to determine if he is a candidate for inpatient detox.  3. Elevated liver function tests. 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 Abdominal exam is benign. Continue to monitor.  4. Hypertension. HCTZ currently on hold. Continue lisinopril.  5. Tobacco use. Counseled on the importance of tobacco cessation.   DVT prophylaxis: lovenox Code Status: full Family Communication: no family present Disposition Plan: pending psychiatry evaluation   Consultants:     Procedures:     Antimicrobials:      Subjective: Patient is feeling better, no new complaints  Objective: Vitals:   03/24/16 2309 03/25/16 0509 03/25/16 1153 03/25/16 1753  BP: (!) 158/99 133/83 (!) 155/97 (!) 167/97  Pulse: 87 (!) 103 91 83  Resp: 18 18 18 16   Temp: 97.9 F (36.6 C) 98.6 F (37 C) 98.9 F (37.2 C) 98.4 F (36.9 C)  TempSrc: Oral Oral Oral Oral  SpO2:  99% 100% 100% 98%  Weight: 61.6 kg (135 lb 12.8 oz)     Height: 5\' 9"  (1.753 m)       Intake/Output Summary (Last 24 hours) at 03/25/16 1829 Last data filed at 03/25/16 1700  Gross per 24 hour  Intake             1330 ml  Output                0 ml  Net             1330 ml   Filed Weights   03/24/16 1658 03/24/16 2309  Weight: 62 kg (136 lb 11.2 oz) 61.6 kg (135 lb 12.8 oz)    Examination:  General exam: Appears calm and comfortable  Respiratory system: Clear to auscultation. Respiratory effort normal. Cardiovascular system: S1 & S2 heard, RRR. No JVD, murmurs, rubs, gallops or clicks. No pedal edema. Gastrointestinal system: Abdomen is nondistended, soft and nontender. No organomegaly or masses felt. Normal bowel sounds heard. Central nervous system: Alert and oriented. No focal neurological deficits. Extremities: Symmetric 5 x 5 power. Skin: No rashes, lesions or ulcers Psychiatry: Judgement and insight appear normal. Mood & affect appropriate.     Data Reviewed: I have personally reviewed following labs and imaging studies  CBC:  Recent Labs Lab 03/24/16 1714  WBC 4.7  HGB 16.4  HCT 45.0  MCV 93.2  PLT [id number]   Basic Metabolic Panel:  Recent Labs Lab 03/24/16 1714 03/25/16 0655 03/25/16 1301  NA 124* 128* 130*  K 3.6 3.7 3.5  CL 87* 95* 95*  CO2 28 26 27   GLUCOSE 132* 75 136*  BUN 7 8 11   CREATININE 0.72 0.63 0.79  CALCIUM 8.4* 8.0* 8.3*   GFR: Estimated Creatinine Clearance: 78.1 mL/min (by C-G formula based on SCr of 0.79 mg/dL). Liver Function Tests:  Recent Labs Lab 03/24/16 1714  AST 125*  ALT 103*  ALKPHOS 90  BILITOT 0.8  PROT 7.6  ALBUMIN 4.2   No results for input(s): LIPASE, AMYLASE in the last 168 hours. No results for input(s): AMMONIA in the last 168 hours. Coagulation Profile: No results for input(s): INR, PROTIME in the last 168 hours. Cardiac Enzymes: No results for input(s): CKTOTAL, CKMB, CKMBINDEX, TROPONINI in the last  168 hours. BNP (last 3 results) No results for input(s): PROBNP in the last 8760 hours. HbA1C: No results for input(s): HGBA1C in the last 72 hours. CBG: No results for input(s): GLUCAP in the last 168 hours. Lipid Profile: No results for input(s): CHOL, HDL, LDLCALC, TRIG, CHOLHDL, LDLDIRECT in the last 72 hours. Thyroid Function Tests: No results for input(s): TSH, T4TOTAL, FREET4, T3FREE, THYROIDAB in the last 72 hours. Anemia Panel: No results for input(s): VITAMINB12, FOLATE, FERRITIN, TIBC, IRON, RETICCTPCT in the last 72 hours. Sepsis Labs: No results for input(s): PROCALCITON, LATICACIDVEN in the last 168 hours.  No results found for this or any previous visit (from the past 240 hour(s)).       Radiology Studies: Dg Chest 2 View  Result Date: 03/24/2016 CLINICAL DATA:  Acute onset of cough.  Initial encounter. EXAM: CHEST  2 VIEW COMPARISON:  None. FINDINGS: The lungs are well-aerated and clear. There is no evidence of focal opacification, pleural effusion or pneumothorax. The heart is normal in size; the mediastinal contour is within normal limits. No acute osseous abnormalities are seen. IMPRESSION: No acute cardiopulmonary process seen. Electronically Signed   By: Garald Balding M.D.   On: 03/24/2016 21:14        Scheduled Meds: . enoxaparin (LOVENOX) injection  40 mg Subcutaneous Q24H  . feeding supplement (ENSURE ENLIVE)  237 mL Oral BID BM  . folic acid  1 mg Oral Daily  . lisinopril  20 mg Oral Daily  . LORazepam  0-4 mg Oral Q6H   Followed by  . [START ON 03/27/2016] LORazepam  0-4 mg Oral Q12H  . multivitamin with minerals  1 tablet Oral Daily  . nicotine  21 mg Transdermal Daily  . thiamine  100 mg Oral Daily   Or  . thiamine  100 mg Intravenous Daily   Continuous Infusions: . sodium chloride       LOS: 0 days    Time spent: 39mins    MEMON,JEHANZEB, MD [facility] Pager [phone]  If 7PM-7AM, please contact  night-coverage [email or web address] Password [facility] 03/25/2016, 6:29 PM

## 2016-03-25 NOTE — Progress Notes (Signed)
This Probation officer spoke with Elwin Sleight, RN who has agreed to set the machine up for consult.    Chesley Noon, MSW, Loura Pardon [facility] Triage Specialist [phone]

## 2016-03-26 LAB — CBC
HCT: 40.9 % (ref 39.0–52.0)
Hemoglobin: 14.6 g/dL (ref 13.0–17.0)
MCH: 33.5 pg (ref 26.0–34.0)
MCHC: 35.7 g/dL (ref 30.0–36.0)
MCV: 93.8 fL (ref 78.0–100.0)
PLATELETS: 131 10*3/uL — AB (ref 150–400)
RBC: 4.36 MIL/uL (ref 4.22–5.81)
RDW: 12.4 % (ref 11.5–15.5)
WBC: 3.4 10*3/uL — ABNORMAL LOW (ref 4.0–10.5)

## 2016-03-26 LAB — BASIC METABOLIC PANEL
ANION GAP: 6 (ref 5–15)
BUN: 8 mg/dL (ref 6–20)
CALCIUM: 8 mg/dL — AB (ref 8.9–10.3)
CO2: 27 mmol/L (ref 22–32)
Chloride: 95 mmol/L — ABNORMAL LOW (ref 101–111)
Creatinine, Ser: 0.58 mg/dL — ABNORMAL LOW (ref 0.61–1.24)
Glucose, Bld: 92 mg/dL (ref 65–99)
Potassium: 2.9 mmol/L — ABNORMAL LOW (ref 3.5–5.1)
SODIUM: 128 mmol/L — AB (ref 135–145)

## 2016-03-26 LAB — COMPREHENSIVE METABOLIC PANEL
ALK PHOS: 72 U/L (ref 38–126)
ALT: 63 U/L (ref 17–63)
AST: 71 U/L — AB (ref 15–41)
Albumin: 3.1 g/dL — ABNORMAL LOW (ref 3.5–5.0)
Anion gap: 8 (ref 5–15)
BUN: 7 mg/dL (ref 6–20)
CALCIUM: 8.1 mg/dL — AB (ref 8.9–10.3)
CHLORIDE: 95 mmol/L — AB (ref 101–111)
CO2: 25 mmol/L (ref 22–32)
CREATININE: 0.61 mg/dL (ref 0.61–1.24)
Glucose, Bld: 117 mg/dL — ABNORMAL HIGH (ref 65–99)
Potassium: 3.2 mmol/L — ABNORMAL LOW (ref 3.5–5.1)
SODIUM: 128 mmol/L — AB (ref 135–145)
Total Bilirubin: 1 mg/dL (ref 0.3–1.2)
Total Protein: 5.8 g/dL — ABNORMAL LOW (ref 6.5–8.1)

## 2016-03-26 MED ORDER — POTASSIUM CHLORIDE 2 MEQ/ML IV SOLN
INTRAVENOUS | Status: DC
  Administered 2016-03-27: 04:00:00 via INTRAVENOUS
  Filled 2016-03-26 (×6): qty 1000

## 2016-03-26 NOTE — Progress Notes (Signed)
PROGRESS NOTE    Greg Lewis  [id number] DOB: [birth year] DOA: 03/24/2016 PCP: No PCP Per Patient    Brief Narrative:  65 year old male with a history of alcoholism, hypertension who presented to the hospital for alcoholism and desiring detox. Workup in the emergency room revealed hyponatremia with a sodium of 124. Remainder of his workup was otherwise negative. He was referred for admission.   Assessment & Plan:   Principal Problem:   Hyponatremia Active Problems:   Alcohol dependence (H[facility])   Elevated liver enzymes   Essential hypertension   Alcohol intoxication ([place])   1. Hyponatremia. Sodium on admission was 124. Likely related to HCTZ use as well as beer drinkers potomania. Started on saline solution with improving sodium. Continue saline overnight.  2. Alcohol dependence. Patient has long history of alcoholism and has been admitted to behavioral health Hospital in the past for detox. He is currently on CIWA protocol with Ativan for withdrawal. He does have a history of withdrawal seizure. He was seen by behavioral health for inpatient detox, but has declined inpatient treatment. Continue ativan for CIWA protocol for now.  3. Elevated liver function tests. 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 Abdominal exam is benign. Improving with hydration Continue to monitor.  4. Hypertension. HCTZ currently on hold. Continue lisinopril.  5. Tobacco use. Counseled on the importance of tobacco cessation.   DVT prophylaxis: lovenox Code Status: full Family Communication: no family present Disposition Plan: discharge home once improved   Consultants:     Procedures:     Antimicrobials:      Subjective: Patient is feeling better, no new complaints  Objective: Vitals:   03/25/16 1753 03/25/16 2318 03/26/16 0518 03/26/16 1118  BP: (!) 167/97 (!) 148/96 138/76 (!) 146/99  Pulse: 83 (!) 101 72 96  Resp: 16 20 20 20   Temp: 98.4 F (36.9 C) 98.2 F (36.8 C) 97.8 F  (36.6 C) 97.9 F (36.6 C)  TempSrc: Oral Oral Oral Oral  SpO2: 98% 98% 97% 97%  Weight:      Height:        Intake/Output Summary (Last 24 hours) at 03/26/16 1614 Last data filed at 03/26/16 1103  Gross per 24 hour  Intake              480 ml  Output              600 ml  Net             -120 ml   Filed Weights   03/24/16 1658 03/24/16 2309  Weight: 62 kg (136 lb 11.2 oz) 61.6 kg (135 lb 12.8 oz)    Examination:  General exam: Appears calm and comfortable  Respiratory system: Clear to auscultation. Respiratory effort normal. Cardiovascular system: S1 & S2 heard, RRR. No JVD, murmurs, rubs, gallops or clicks. No pedal edema. Gastrointestinal system: Abdomen is nondistended, soft and nontender. No organomegaly or masses felt. Normal bowel sounds heard. Central nervous system: Alert and oriented. No focal neurological deficits. Extremities: Symmetric 5 x 5 power. Skin: No rashes, lesions or ulcers Psychiatry: Judgement and insight appear normal. Mood & affect appropriate.     Data Reviewed: I have personally reviewed following labs and imaging studies  CBC:  Recent Labs Lab 03/24/16 1714 03/26/16 0709  WBC 4.7 3.4*  HGB 16.4 14.6  HCT 45.0 40.9  MCV 93.2 93.8  PLT 163 [id number]*   Basic Metabolic Panel:  Recent Labs Lab 03/24/16 1714 03/25/16 0655 03/25/16 1301 03/26/16 0145 03/26/16 [id number]  NA 124* 128* 130* 128* 128*  K 3.6 3.7 3.5 2.9* 3.2*  CL 87* 95* 95* 95* 95*  CO2 28 26 27 27 25   GLUCOSE 132* 75 136* 92 117*  BUN 7 8 11 8 7   CREATININE 0.72 0.63 0.79 0.58* 0.61  CALCIUM 8.4* 8.0* 8.3* 8.0* 8.1*   GFR: Estimated Creatinine Clearance: 78.1 mL/min (by C-G formula based on SCr of 0.61 mg/dL). Liver Function Tests:  Recent Labs Lab 03/24/16 1714 03/26/16 0709  AST 125* 71*  ALT 103* 63  ALKPHOS 90 72  BILITOT 0.8 1.0  PROT 7.6 5.8*  ALBUMIN 4.2 3.1*   No results for input(s): LIPASE, AMYLASE in the last 168 hours. No results for input(s):  AMMONIA in the last 168 hours. Coagulation Profile: No results for input(s): INR, PROTIME in the last 168 hours. Cardiac Enzymes: No results for input(s): CKTOTAL, CKMB, CKMBINDEX, TROPONINI in the last 168 hours. BNP (last 3 results) No results for input(s): PROBNP in the last 8760 hours. HbA1C: No results for input(s): HGBA1C in the last 72 hours. CBG: No results for input(s): GLUCAP in the last 168 hours. Lipid Profile: No results for input(s): CHOL, HDL, LDLCALC, TRIG, CHOLHDL, LDLDIRECT in the last 72 hours. Thyroid Function Tests: No results for input(s): TSH, T4TOTAL, FREET4, T3FREE, THYROIDAB in the last 72 hours. Anemia Panel: No results for input(s): VITAMINB12, FOLATE, FERRITIN, TIBC, IRON, RETICCTPCT in the last 72 hours. Sepsis Labs: No results for input(s): PROCALCITON, LATICACIDVEN in the last 168 hours.  No results found for this or any previous visit (from the past 240 hour(s)).       Radiology Studies: Dg Chest 2 View  Result Date: 03/24/2016 CLINICAL DATA:  Acute onset of cough.  Initial encounter. EXAM: CHEST  2 VIEW COMPARISON:  None. FINDINGS: The lungs are well-aerated and clear. There is no evidence of focal opacification, pleural effusion or pneumothorax. The heart is normal in size; the mediastinal contour is within normal limits. No acute osseous abnormalities are seen. IMPRESSION: No acute cardiopulmonary process seen. Electronically Signed   By: Garald Balding M.D.   On: 03/24/2016 21:14        Scheduled Meds: . enoxaparin (LOVENOX) injection  40 mg Subcutaneous Q24H  . feeding supplement (ENSURE ENLIVE)  237 mL Oral BID BM  . folic acid  1 mg Oral Daily  . lisinopril  20 mg Oral Daily  . LORazepam  0-4 mg Oral Q6H   Followed by  . [START ON 03/27/2016] LORazepam  0-4 mg Oral Q12H  . multivitamin with minerals  1 tablet Oral Daily  . nicotine  21 mg Transdermal Daily  . thiamine  100 mg Oral Daily   Or  . thiamine  100 mg Intravenous Daily     Continuous Infusions: . sodium chloride 0.9 % 1,000 mL with potassium chloride 40 mEq infusion       LOS: 1 day    Time spent: 20mins    Greg Lewan, MD [facility] Pager [phone]  If 7PM-7AM, please contact night-coverage [email or web address] Password [email or web address] 03/26/2016, 4:14 PM

## 2016-03-27 DIAGNOSIS — F1010 Alcohol abuse, uncomplicated: Secondary | ICD-10-CM

## 2016-03-27 LAB — BASIC METABOLIC PANEL
ANION GAP: 5 (ref 5–15)
BUN: 8 mg/dL (ref 6–20)
CALCIUM: 8.1 mg/dL — AB (ref 8.9–10.3)
CO2: 26 mmol/L (ref 22–32)
CREATININE: 0.65 mg/dL (ref 0.61–1.24)
Chloride: 99 mmol/L — ABNORMAL LOW (ref 101–111)
Glucose, Bld: 90 mg/dL (ref 65–99)
Potassium: 3.5 mmol/L (ref 3.5–5.1)
SODIUM: 130 mmol/L — AB (ref 135–145)

## 2016-03-27 LAB — GLUCOSE, CAPILLARY: GLUCOSE-CAPILLARY: 72 mg/dL (ref 65–99)

## 2016-03-27 MED ORDER — POTASSIUM CHLORIDE IN NACL 40-0.9 MEQ/L-% IV SOLN
INTRAVENOUS | Status: DC
  Administered 2016-03-27: 125 mL/h via INTRAVENOUS

## 2016-03-27 MED ORDER — HYDROXYZINE HCL 25 MG PO TABS: 25.0000 mg | ORAL_TABLET | Freq: Three times a day (TID) | ORAL | 0 refills | Status: AC | PRN

## 2016-03-27 MED ORDER — BENAZEPRIL HCL 20 MG PO TABS: 20.0000 mg | ORAL_TABLET | Freq: Every day | ORAL | 0 refills | Status: AC

## 2016-03-27 NOTE — Clinical Social Work Note (Signed)
Per MD, pt is requesting substance abuse referrals. CSW provided list of inpatient, outpatient, and intensive outpatient resources in [place].   Greg Lewis, [name]

## 2016-03-27 NOTE — Progress Notes (Signed)
Patient being d/c home with prescriptions. IV cath removed and intact. No c/o pain at site or at this time. Awaiting for transport out.

## 2016-03-27 NOTE — Clinical Social Work Note (Signed)
CSW received referral for substance abuse. Pt was evaluated by TTS and refused any treatment options. Discussed with MD. Anticipate d/c later today. CSW will sign off due to refusal, but can be reconsulted if needed.  Benay Pike, [place]

## 2016-03-27 NOTE — Discharge Summary (Signed)
Physician Discharge Summary  Greg Lewis [id number] DOB: 1951/03/13 DOA: 03/24/2016  PCP: No PCP Per Patient  Admit date: 03/24/2016 Discharge date: 03/27/2016  Admitted From: home Disposition: home  Recommendations for Outpatient Follow-up:  1. Follow up with PCP in 1-2 weeks 2. Please obtain BMP/CBC in one week   Home Health: Equipment/Devices:  Discharge Condition:stable CODE STATUS: full Diet recommendation: Heart Healthy   Brief/Interim Summary: 66 year old male with a history of alcoholism, hypertension who presented to the hospital for alcoholism and desiring detox. Workup in the emergency room revealed hyponatremia with a sodium of 124. Remainder of his workup was otherwise negative. He was referred for admission.  Discharge Diagnoses:  Principal Problem:   Hyponatremia Active Problems:   Alcohol dependence (HCC)   Elevated liver enzymes   Essential hypertension   Alcohol intoxication ([place])  1. Hyponatremia. Sodium on admission was 124. Likely related to HCTZ use as well as beer drinkers potomania. Started on saline solution with improving sodium. Patient is currently asymptomatic. Will need repeat bmet in 1 week..  2. Alcohol dependence. Patient has long history of alcoholism and has been admitted to behavioral health Hospital in the past for detox. He was treated with CIWA protocol with Ativan for withdrawal. He does have a history of withdrawal seizure. He was seen by behavioral health for inpatient detox, but has declined inpatient treatment. At this time, he does not have any evidence of withdrawal. CIWA scores are 0-2. He was seen [facility] [place] received resources for inpatient/outpatient alcohol rehabilitation in [place]   3. Elevated liver function tests. 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 Abdominal exam is benign. Improved with hydration   4. Hypertension. HCTZ currently on hold. Continue lisinopril.  5. Tobacco use. Counseled on the  importance of tobacco cessation.  Discharge Instructions  Discharge Instructions    Diet - low sodium heart healthy    Complete by:  As directed    Increase activity slowly    Complete by:  As directed      Allergies as of 03/27/2016      Reactions   Cabbage Other (See Comments)   Said it was bout lethal      Medication List    STOP taking these medications   lisinopril-hydrochlorothiazide 20-25 MG tablet Commonly known as:  PRINZIDE,ZESTORETIC Replaced by:  benazepril 20 MG tablet     TAKE these medications   benazepril 20 MG tablet Commonly known as:  LOTENSIN Take 1 tablet (20 mg total) by mouth daily. Replaces:  lisinopril-hydrochlorothiazide 20-25 MG tablet   hydrOXYzine 25 MG tablet Commonly known as:  ATARAX/VISTARIL Take 1 tablet (25 mg total) by mouth 3 (three) times daily as needed for anxiety.   ibuprofen 200 MG tablet Commonly known as:  ADVIL,MOTRIN Take 200 mg by mouth daily as needed for mild pain or moderate pain.       Allergies  Allergen Reactions  . Cabbage Other (See Comments)    Said it was bout lethal    Consultations:  TTS   Procedures/Studies: Dg Chest 2 View  Result Date: 03/24/2016 CLINICAL DATA:  Acute onset of cough.  Initial encounter. EXAM: CHEST  2 VIEW COMPARISON:  None. FINDINGS: The lungs are well-aerated and clear. There is no evidence of focal opacification, pleural effusion or pneumothorax. The heart is normal in size; the mediastinal contour is within normal limits. No acute osseous abnormalities are seen. IMPRESSION: No acute cardiopulmonary process seen. Electronically Signed   By: Garald Balding M.D.   On: 03/24/2016  21:14       Subjective: Feels well. No new complaints  Discharge Exam: Vitals:   03/26/16 2119 03/27/16 0500  BP: (!) 156/96 125/82  Pulse:  62  Resp:  18  Temp:  98.1 F (36.7 C)   Vitals:   03/26/16 1830 03/26/16 2118 03/26/16 2119 03/27/16 0500  BP: (!) 174/84 (!) 143/106 (!) 156/96  125/82  Pulse: 85 79  62  Resp: 20 18  18   Temp: 97.9 F (36.6 C) 97.7 F (36.5 C)  98.1 F (36.7 C)  TempSrc: Oral Oral  Oral  SpO2: 100% 100%  97%  Weight:      Height:        General: Pt is alert, awake, not in acute distress Cardiovascular: RRR, S1/S2 +, no rubs, no gallops Respiratory: CTA bilaterally, no wheezing, no rhonchi Abdominal: Soft, NT, ND, bowel sounds + Extremities: no edema, no cyanosis    The results of significant diagnostics from this hospitalization (including imaging, microbiology, ancillary and laboratory) are listed below for reference.     Microbiology: No results found for this or any previous visit (from the past 240 hour(s)).   Labs: BNP (last 3 results) No results for input(s): BNP in the last 8760 hours. Basic Metabolic Panel:  Recent Labs Lab 03/25/16 0655 03/25/16 1301 03/26/16 0145 03/26/16 0709 03/27/16 0440  NA 128* 130* 128* 128* 130*  K 3.7 3.5 2.9* 3.2* 3.5  CL 95* 95* 95* 95* 99*  CO2 26 27 27 25 26   GLUCOSE 75 136* 92 117* 90  BUN 8 11 8 7 8   CREATININE 0.63 0.79 0.58* 0.61 0.65  CALCIUM 8.0* 8.3* 8.0* 8.1* 8.1*   Liver Function Tests:  Recent Labs Lab 03/24/16 1714 03/26/16 0709  AST 125* 71*  ALT 103* 63  ALKPHOS 90 72  BILITOT 0.8 1.0  PROT 7.6 5.8*  ALBUMIN 4.2 3.1*   No results for input(s): LIPASE, AMYLASE in the last 168 hours. No results for input(s): AMMONIA in the last 168 hours. CBC:  Recent Labs Lab 03/24/16 1714 03/26/16 0709  WBC 4.7 3.4*  HGB 16.4 14.6  HCT 45.0 40.9  MCV 93.2 93.8  PLT 163 131*   Cardiac Enzymes: No results for input(s): CKTOTAL, CKMB, CKMBINDEX, TROPONINI in the last 168 hours. BNP: Invalid input(s): POCBNP CBG:  Recent Labs Lab 03/27/16 0742  GLUCAP 72   D-Dimer No results for input(s): DDIMER in the last 72 hours. Hgb A1c No results for input(s): HGBA1C in the last 72 hours. Lipid Profile No results for input(s): CHOL, HDL, LDLCALC, TRIG, CHOLHDL,  LDLDIRECT in the last 72 hours. Thyroid function studies No results for input(s): TSH, T4TOTAL, T3FREE, THYROIDAB in the last 72 hours.  Invalid input(s): FREET3 Anemia work up No results for input(s): VITAMINB12, FOLATE, FERRITIN, TIBC, IRON, RETICCTPCT in the last 72 hours. Urinalysis    Component Value Date/Time   COLORURINE YELLOW 03/24/2016 2030   [place] 03/24/2016 2030   LABSPEC 1.006 03/24/2016 2030   PHURINE 7.0 03/24/2016 2030   GLUCOSEU NEGATIVE 03/24/2016 2030   [place] NEGATIVE 03/24/2016 2030   [place] NEGATIVE 03/24/2016 2030   KETONESUR 5 (A) 03/24/2016 2030   PROTEINUR NEGATIVE 03/24/2016 2030   NITRITE NEGATIVE 03/24/2016 2030   LEUKOCYTESUR NEGATIVE 03/24/2016 2030   Sepsis Labs Invalid input(s): PROCALCITONIN,  WBC,  LACTICIDVEN Microbiology No results found for this or any previous visit (from the past 240 hour(s)).   Time coordinating discharge: Over 30 minutes  SIGNED:  Fischer Halley, MD  [facility] 03/27/2016, 1:24 PM Pager   If 7PM-7AM, please contact night-coverage [email or web address] Password [email or web address]

## 2017-07-09 IMAGING — DX DG CHEST 2V
2 series · 2 of 2 positions shown · non-contrast
Comparison: None.

CLINICAL DATA: Acute onset of cough.  Initial encounter.

EXAM:
CHEST  2 VIEW

[chest lat]
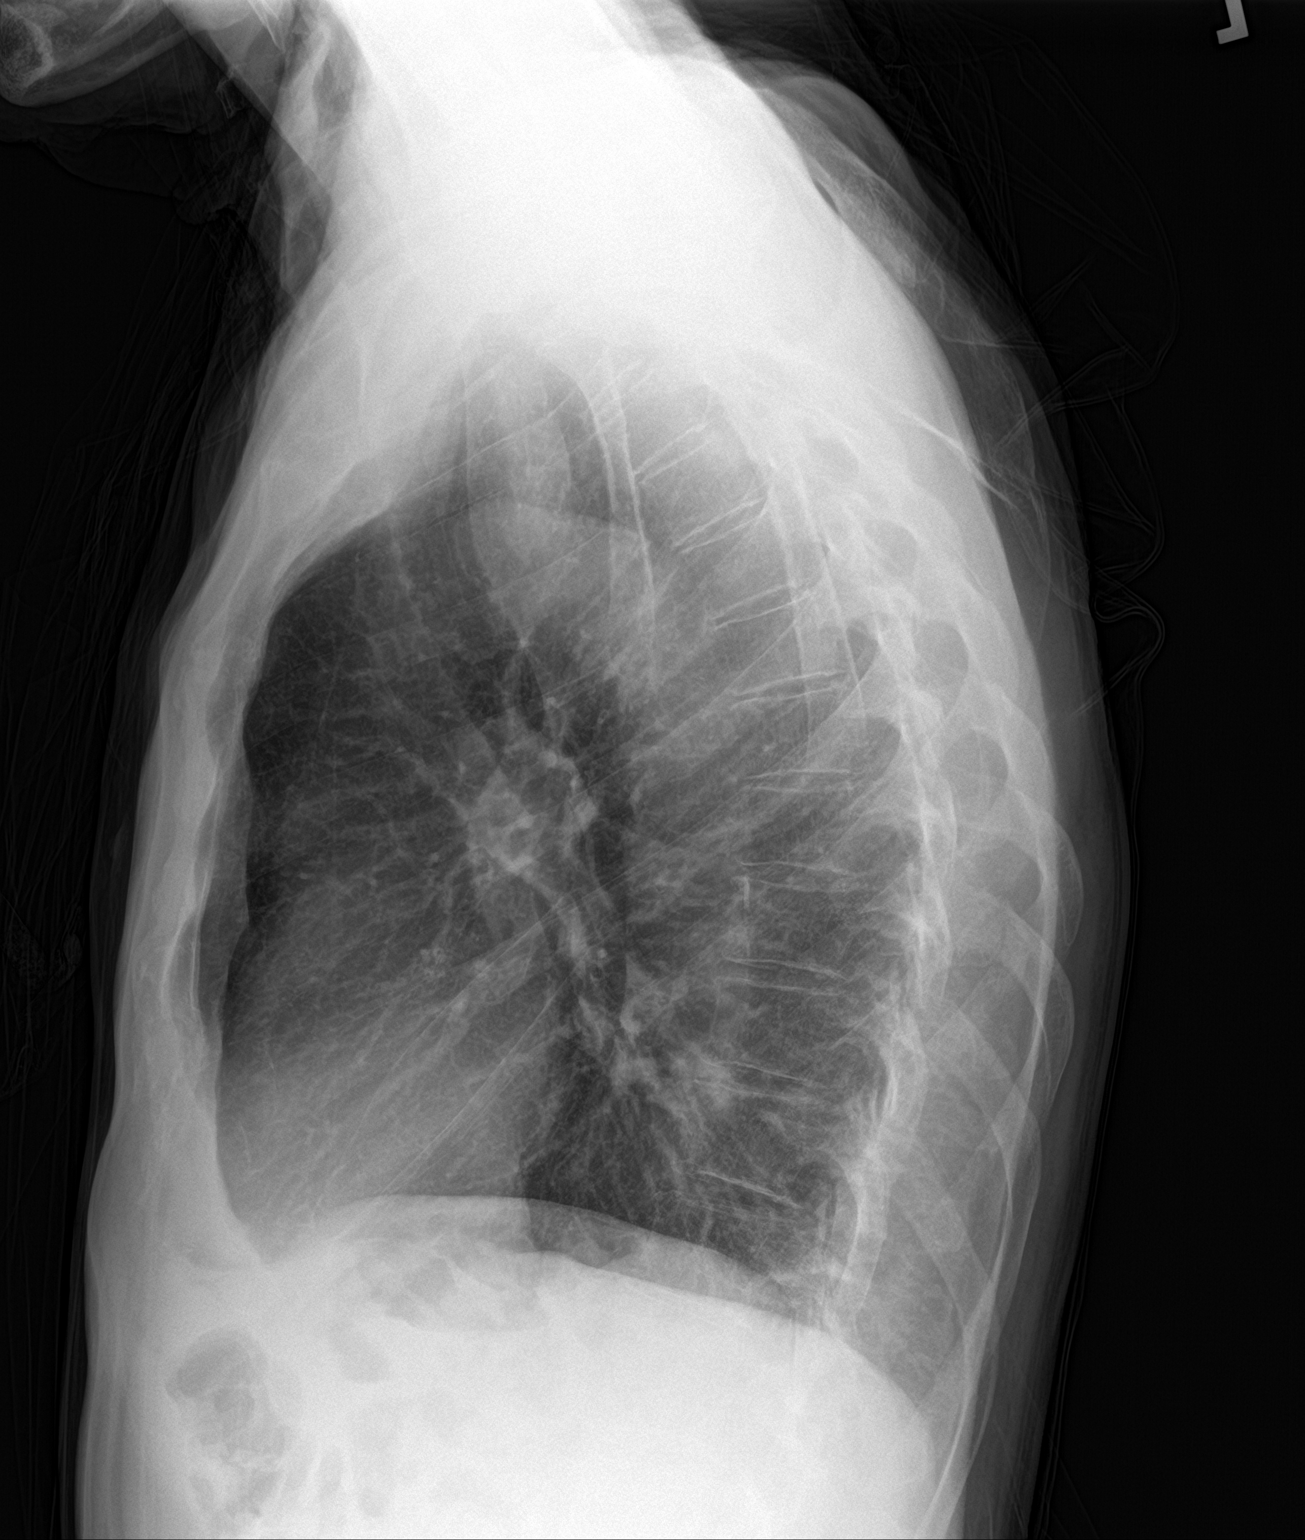

[chest pa]
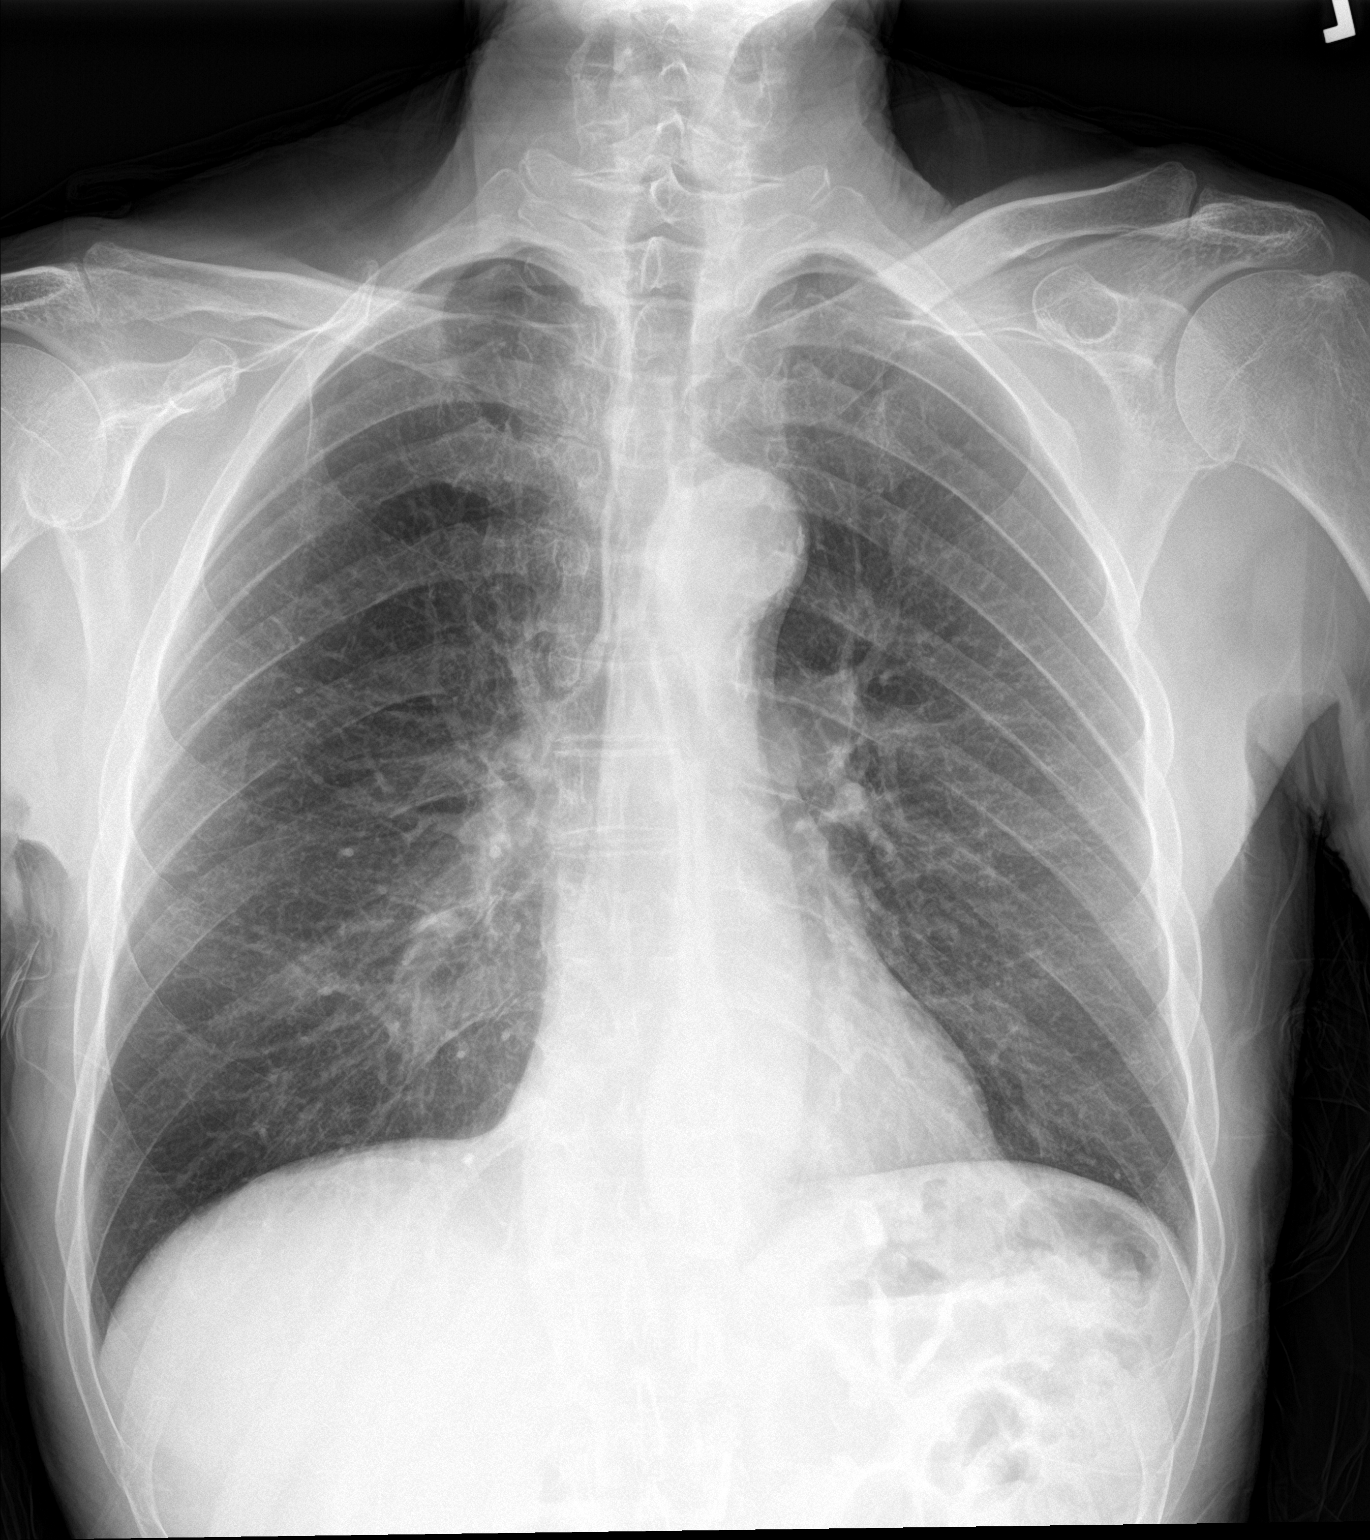

[2 of 2 positions shown; findings below may reference images not displayed]

FINDINGS: The lungs are well-aerated and clear. There is no evidence of focal
opacification, pleural effusion or pneumothorax.

The heart is normal in size; the mediastinal contour is within
normal limits. No acute osseous abnormalities are seen.
IMPRESSION: No acute cardiopulmonary process seen.
